# Patient Record
Sex: Male | Born: 1950 | Race: White | Hispanic: No | Smoking: Former smoker
Health system: Southern US, Community
[De-identification: ages and names within clinical notes are randomized; demographics above are authoritative.]

## PROBLEM LIST (undated history)

## (undated) DIAGNOSIS — E039 Hypothyroidism, unspecified: Secondary | ICD-10-CM

## (undated) DIAGNOSIS — E785 Hyperlipidemia, unspecified: Secondary | ICD-10-CM

## (undated) DIAGNOSIS — M549 Dorsalgia, unspecified: Secondary | ICD-10-CM

## (undated) DIAGNOSIS — I2510 Atherosclerotic heart disease of native coronary artery without angina pectoris: Secondary | ICD-10-CM

## (undated) DIAGNOSIS — I10 Essential (primary) hypertension: Secondary | ICD-10-CM

## (undated) DIAGNOSIS — M1990 Unspecified osteoarthritis, unspecified site: Secondary | ICD-10-CM

## (undated) DIAGNOSIS — E079 Disorder of thyroid, unspecified: Secondary | ICD-10-CM

## (undated) DIAGNOSIS — K5792 Diverticulitis of intestine, part unspecified, without perforation or abscess without bleeding: Secondary | ICD-10-CM

## (undated) DIAGNOSIS — Z951 Presence of aortocoronary bypass graft: Secondary | ICD-10-CM

## (undated) HISTORY — DX: Unspecified osteoarthritis, unspecified site: M19.90

## (undated) HISTORY — PX: UMBILICAL HERNIA REPAIR: SHX196

## (undated) HISTORY — PX: KNEE ARTHROSCOPY: SUR90

## (undated) HISTORY — DX: Hyperlipidemia, unspecified: E78.5

## (undated) HISTORY — DX: Atherosclerotic heart disease of native coronary artery without angina pectoris: I25.10

## (undated) HISTORY — DX: Presence of aortocoronary bypass graft: Z95.1

## (undated) HISTORY — PX: KNEE ARTHROSCOPY: SHX127

## (undated) HISTORY — PX: CORONARY ARTERY BYPASS GRAFT: SHX141

## (undated) HISTORY — PX: INGUINAL HERNIA REPAIR: SUR1180

## (undated) HISTORY — DX: Essential (primary) hypertension: I10

## (undated) HISTORY — DX: Hypothyroidism, unspecified: E03.9

---

## 2015-12-31 ENCOUNTER — Emergency Department (HOSPITAL_COMMUNITY)
Admission: EM | Admit: 2015-12-31 | Discharge: 2015-12-31 | Disposition: A | Discharge status: Home / Self Care | Payer: Medicare Other | Attending: Emergency Medicine | Admitting: Emergency Medicine

## 2015-12-31 ENCOUNTER — Encounter (HOSPITAL_COMMUNITY): Payer: Self-pay | Admitting: Emergency Medicine

## 2015-12-31 DIAGNOSIS — M5136 Other intervertebral disc degeneration, lumbar region: Secondary | ICD-10-CM | POA: Diagnosis not present

## 2015-12-31 DIAGNOSIS — S39012A Strain of muscle, fascia and tendon of lower back, initial encounter: Secondary | ICD-10-CM | POA: Diagnosis not present

## 2015-12-31 DIAGNOSIS — X500XXA Overexertion from strenuous movement or load, initial encounter: Secondary | ICD-10-CM | POA: Diagnosis not present

## 2015-12-31 DIAGNOSIS — I2510 Atherosclerotic heart disease of native coronary artery without angina pectoris: Secondary | ICD-10-CM | POA: Diagnosis not present

## 2015-12-31 DIAGNOSIS — Y929 Unspecified place or not applicable: Secondary | ICD-10-CM | POA: Insufficient documentation

## 2015-12-31 DIAGNOSIS — Y9389 Activity, other specified: Secondary | ICD-10-CM | POA: Diagnosis not present

## 2015-12-31 DIAGNOSIS — S3992XA Unspecified injury of lower back, initial encounter: Secondary | ICD-10-CM | POA: Diagnosis present

## 2015-12-31 DIAGNOSIS — Y999 Unspecified external cause status: Secondary | ICD-10-CM | POA: Insufficient documentation

## 2015-12-31 DIAGNOSIS — Z951 Presence of aortocoronary bypass graft: Secondary | ICD-10-CM | POA: Diagnosis not present

## 2015-12-31 HISTORY — DX: Dorsalgia, unspecified: M54.9

## 2015-12-31 HISTORY — DX: Atherosclerotic heart disease of native coronary artery without angina pectoris: I25.10

## 2015-12-31 HISTORY — DX: Disorder of thyroid, unspecified: E07.9

## 2015-12-31 HISTORY — DX: Diverticulitis of intestine, part unspecified, without perforation or abscess without bleeding: K57.92

## 2015-12-31 MED ORDER — HYDROCODONE-ACETAMINOPHEN 5-325 MG PO TABS: 1.0000 | ORAL_TABLET | ORAL | 0 refills | Status: DC | PRN

## 2015-12-31 MED ORDER — DEXAMETHASONE 4 MG PO TABS: 4.0000 mg | ORAL_TABLET | Freq: Two times a day (BID) | ORAL | 0 refills | Status: DC

## 2015-12-31 MED ORDER — CYCLOBENZAPRINE HCL 10 MG PO TABS: 10.0000 mg | ORAL_TABLET | Freq: Three times a day (TID) | ORAL | 0 refills | Status: DC

## 2015-12-31 MED ORDER — KETOROLAC TROMETHAMINE 60 MG/2ML IM SOLN
60.0000 mg | Freq: Once | INTRAMUSCULAR | Status: AC
  Administered 2015-12-31: 60 mg via INTRAMUSCULAR
  Filled 2015-12-31: qty 2

## 2015-12-31 MED ORDER — DEXAMETHASONE SODIUM PHOSPHATE 4 MG/ML IJ SOLN
8.0000 mg | Freq: Once | INTRAMUSCULAR | Status: AC
  Administered 2015-12-31: 8 mg via INTRAMUSCULAR
  Filled 2015-12-31: qty 2
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
## 2015-12-31 NOTE — ED Notes (Signed)
ED Provider at bedside. 

## 2015-12-31 NOTE — ED Triage Notes (Signed)
Pt reports low back pain for a few days after unloading moving truck.  States he just moved here and is out of his pain medications.

## 2015-12-31 NOTE — ED Notes (Signed)
Pt reports he moved down to [place] 2 days ago and been lifting and moving furniture for the past 2 days. Pt reports he has chronic back pain for which he uses Percocet for. Pt reports he has ran out of his Percocet and is "looking for someone to refill my Percocet" to help with his pain until he can get established with a new PCP here in [place]. Pt reports he also takes heart medication and thyroid medication, but those medications he currently has enough of.

## 2015-12-31 NOTE — Discharge Instructions (Signed)
Heating pad to your back maybe helpful. Please rest your back is much as possible. Use Flexeril and Decadron daily. Use Norco for pain if needed. Flexeril and Norco may cause drowsiness, please use these medications with caution. Please see Dr. Lorin Mercy for orthopedic evaluation and management.

## 2016-03-07 ENCOUNTER — Other Ambulatory Visit: Payer: Self-pay | Admitting: Family Medicine

## 2016-03-07 ENCOUNTER — Encounter: Payer: Self-pay | Admitting: Family Medicine

## 2016-03-07 ENCOUNTER — Ambulatory Visit (INDEPENDENT_AMBULATORY_CARE_PROVIDER_SITE_OTHER): Payer: Medicare HMO | Admitting: Family Medicine

## 2016-03-07 VITALS — BP 138/72 | HR 72 | Temp 98.0°F | Resp 16 | Ht 65.75 in | Wt 171.0 lb

## 2016-03-07 DIAGNOSIS — L570 Actinic keratosis: Secondary | ICD-10-CM

## 2016-03-07 DIAGNOSIS — M1710 Unilateral primary osteoarthritis, unspecified knee: Secondary | ICD-10-CM | POA: Insufficient documentation

## 2016-03-07 DIAGNOSIS — M179 Osteoarthritis of knee, unspecified: Secondary | ICD-10-CM | POA: Insufficient documentation

## 2016-03-07 DIAGNOSIS — M1711 Unilateral primary osteoarthritis, right knee: Secondary | ICD-10-CM | POA: Diagnosis not present

## 2016-03-07 DIAGNOSIS — E039 Hypothyroidism, unspecified: Secondary | ICD-10-CM | POA: Diagnosis not present

## 2016-03-07 DIAGNOSIS — I25810 Atherosclerosis of coronary artery bypass graft(s) without angina pectoris: Secondary | ICD-10-CM

## 2016-03-07 DIAGNOSIS — M19049 Primary osteoarthritis, unspecified hand: Secondary | ICD-10-CM

## 2016-03-07 DIAGNOSIS — M47816 Spondylosis without myelopathy or radiculopathy, lumbar region: Secondary | ICD-10-CM | POA: Insufficient documentation

## 2016-03-07 DIAGNOSIS — K5790 Diverticulosis of intestine, part unspecified, without perforation or abscess without bleeding: Secondary | ICD-10-CM | POA: Insufficient documentation

## 2016-03-07 DIAGNOSIS — G894 Chronic pain syndrome: Secondary | ICD-10-CM | POA: Diagnosis not present

## 2016-03-07 DIAGNOSIS — I2510 Atherosclerotic heart disease of native coronary artery without angina pectoris: Secondary | ICD-10-CM | POA: Insufficient documentation

## 2016-03-07 DIAGNOSIS — E7800 Pure hypercholesterolemia, unspecified: Secondary | ICD-10-CM

## 2016-03-07 DIAGNOSIS — K5730 Diverticulosis of large intestine without perforation or abscess without bleeding: Secondary | ICD-10-CM | POA: Diagnosis not present

## 2016-03-07 MED ORDER — METOPROLOL SUCCINATE ER 100 MG PO TB24: 100.0000 mg | ORAL_TABLET | Freq: Every day | ORAL | 6 refills | Status: DC

## 2016-03-07 MED ORDER — OXYCODONE-ACETAMINOPHEN 5-325 MG PO TABS: 1.0000 | ORAL_TABLET | Freq: Two times a day (BID) | ORAL | 0 refills | Status: DC | PRN

## 2016-03-07 MED ORDER — GEMFIBROZIL 600 MG PO TABS: 600.0000 mg | ORAL_TABLET | Freq: Two times a day (BID) | ORAL | 6 refills | Status: DC

## 2016-03-07 MED ORDER — LEVOTHYROXINE SODIUM 50 MCG PO TABS: 50.0000 ug | ORAL_TABLET | Freq: Every day | ORAL | 6 refills | Status: DC

## 2016-03-07 NOTE — Assessment & Plan Note (Signed)
Anoscopy is up-to-date. He tries to avoid foods that cause flares
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
## 2016-03-07 NOTE — Patient Instructions (Signed)
Return to have your fasting labs drawn Referral to cardiology Referral to dermatology F/U 4 months

## 2016-03-07 NOTE — Assessment & Plan Note (Signed)
We'll establish with cardiology will check his cholesterol levels forcefully he is hard he had an event in the gemfibrozil and niacin though they may lower his level some his LDL still too high and they're not giving much secondary prevention against another event. I think he may be a good candidate for one of the injectable cholesterol medications but will defer this to cardiology

## 2016-03-07 NOTE — Assessment & Plan Note (Signed)
Reviewed his records from neurosurgery and [facility] from 2017, maintained on percocet #60 Database reviewed for [place] ,no abberrant behavior UDS done today  Pain contract signed Given script for Percocet 5-325mg  BID prn #60 tablets

## 2016-03-07 NOTE — Assessment & Plan Note (Signed)
Continue Synthroid will recheck his thyroid function studies when he returns for his fasting labs

## 2016-03-16 LAB — DRUG ABUSE PANEL 10-50 NO CONF, U
AMPHETAMINES (1000 ng/mL SCRN): NEGATIVE
BARBITURATES: NEGATIVE
BENZODIAZEPINES: NEGATIVE
COCAINE METABOLITES: NEGATIVE
MARIJUANA MET (50 NG/ML SCRN): NEGATIVE
METHADONE: NEGATIVE
METHAQUALONE: NEGATIVE
OPIATES: NEGATIVE
PHENCYCLIDINE: NEGATIVE
PROPOXYPHENE: NEGATIVE

## 2016-03-18 ENCOUNTER — Other Ambulatory Visit: Payer: Self-pay

## 2016-03-19 ENCOUNTER — Other Ambulatory Visit: Payer: Medicare HMO

## 2016-03-19 ENCOUNTER — Other Ambulatory Visit: Payer: Self-pay | Admitting: Family Medicine

## 2016-03-19 DIAGNOSIS — I25810 Atherosclerosis of coronary artery bypass graft(s) without angina pectoris: Secondary | ICD-10-CM

## 2016-03-19 DIAGNOSIS — E039 Hypothyroidism, unspecified: Secondary | ICD-10-CM | POA: Diagnosis not present

## 2016-03-19 DIAGNOSIS — R7309 Other abnormal glucose: Secondary | ICD-10-CM | POA: Diagnosis not present

## 2016-03-19 LAB — LIPID PANEL
CHOLESTEROL: 239 mg/dL — AB (ref <200)
HDL: 51 mg/dL (ref >40)
LDL CALC: 143 mg/dL — AB (ref <100)
Total CHOL/HDL Ratio: 4.7 Ratio (ref <5.0)
Triglycerides: 227 mg/dL — ABNORMAL HIGH (ref <150)
VLDL: 45 mg/dL — AB (ref <30)

## 2016-03-19 LAB — CBC WITH DIFFERENTIAL/PLATELET
BASOS ABS: 0 {cells}/uL (ref 0–200)
Basophils Relative: 0 %
EOS ABS: 156 {cells}/uL (ref 15–500)
Eosinophils Relative: 3 %
HCT: 41.4 % (ref 38.5–50.0)
HEMOGLOBIN: 14.6 g/dL (ref 13.0–17.0)
Lymphocytes Relative: 39 %
Lymphs Abs: 2028 cells/uL (ref 850–3900)
MCH: 29.3 pg (ref 27.0–33.0)
MCHC: 35.3 g/dL (ref 32.0–36.0)
MCV: 83 fL (ref 80.0–100.0)
MONOS PCT: 15 %
MPV: 8.8 fL (ref 7.5–12.5)
Monocytes Absolute: 780 cells/uL (ref 200–950)
Neutro Abs: 2236 cells/uL (ref 1500–7800)
Neutrophils Relative %: 43 %
PLATELETS: 297 10*3/uL (ref 140–400)
RBC: 4.99 MIL/uL (ref 4.20–5.80)
RDW: 13.8 % (ref 11.0–15.0)
WBC: 5.2 10*3/uL (ref 3.8–10.8)

## 2016-03-19 LAB — T3, FREE: T3 FREE: 3.3 pg/mL (ref 2.3–4.2)

## 2016-03-19 LAB — COMPREHENSIVE METABOLIC PANEL
ALT: 23 U/L (ref 9–46)
AST: 24 U/L (ref 10–35)
Albumin: 4.4 g/dL (ref 3.6–5.1)
Alkaline Phosphatase: 59 U/L (ref 40–115)
BILIRUBIN TOTAL: 0.4 mg/dL (ref 0.2–1.2)
BUN: 22 mg/dL (ref 7–25)
CO2: 28 mmol/L (ref 20–31)
CREATININE: 0.87 mg/dL (ref 0.70–1.25)
Calcium: 9.7 mg/dL (ref 8.6–10.3)
Chloride: 102 mmol/L (ref 98–110)
Glucose, Bld: 128 mg/dL — ABNORMAL HIGH (ref 70–99)
Potassium: 4.4 mmol/L (ref 3.5–5.3)
SODIUM: 139 mmol/L (ref 135–146)
Total Protein: 7.4 g/dL (ref 6.1–8.1)

## 2016-03-19 LAB — T4, FREE: FREE T4: 1.1 ng/dL (ref 0.8–1.8)

## 2016-03-19 LAB — TSH: TSH: 1.33 mIU/L (ref 0.40–4.50)

## 2016-03-21 LAB — HEMOGLOBIN A1C
Hgb A1c MFr Bld: 5.6 % (ref <5.7)
Mean Plasma Glucose: 114 mg/dL

## 2016-04-07 ENCOUNTER — Telehealth: Payer: Self-pay | Admitting: *Deleted

## 2016-04-07 MED ORDER — OXYCODONE-ACETAMINOPHEN 5-325 MG PO TABS: 1.0000 | ORAL_TABLET | Freq: Two times a day (BID) | ORAL | 0 refills | Status: DC | PRN

## 2016-04-07 NOTE — Telephone Encounter (Signed)
Okay to refill? 

## 2016-04-07 NOTE — Telephone Encounter (Signed)
Prescription printed and patient made aware to come to office to pick up.  

## 2016-04-07 NOTE — Telephone Encounter (Signed)
Received call from patient.   Requested refill on Oxycodone.   Ok to refill??  Last office visit/ refill 03/07/2016.

## 2016-04-09 ENCOUNTER — Encounter: Payer: Self-pay | Admitting: Cardiovascular Disease

## 2016-04-09 ENCOUNTER — Ambulatory Visit (INDEPENDENT_AMBULATORY_CARE_PROVIDER_SITE_OTHER): Payer: Medicare HMO | Admitting: Cardiovascular Disease

## 2016-04-09 VITALS — BP 120/78 | HR 73 | Ht 67.0 in | Wt 172.0 lb

## 2016-04-09 DIAGNOSIS — E782 Mixed hyperlipidemia: Secondary | ICD-10-CM

## 2016-04-09 DIAGNOSIS — Z79899 Other long term (current) drug therapy: Secondary | ICD-10-CM | POA: Diagnosis not present

## 2016-04-09 DIAGNOSIS — I25708 Atherosclerosis of coronary artery bypass graft(s), unspecified, with other forms of angina pectoris: Secondary | ICD-10-CM | POA: Diagnosis not present

## 2016-04-09 MED ORDER — ROSUVASTATIN CALCIUM 5 MG PO TABS
5.0000 mg | ORAL_TABLET | Freq: Every day | ORAL | 3 refills | Status: DC
Start: 2016-04-09 — End: 2017-02-16

## 2016-04-09 MED ORDER — NITROGLYCERIN 0.4 MG SL SUBL: 0.4000 mg | SUBLINGUAL_TABLET | SUBLINGUAL | 3 refills | Status: DC | PRN

## 2016-04-09 NOTE — Patient Instructions (Addendum)
Medication Instructions:  START CRESTOR 5 MG DAILY  NITRO 4 MG   Labwork: Your physician recommends that you return for lab work in: 3 MONTHS  LIPIDS   Testing/Procedures: NONE  Follow-Up: Your physician wants you to follow-up in: 6 MONTHS.  You will receive a reminder letter in the mail two months in advance. If you don't receive a letter, please call our office to schedule the follow-up appointment.   Any Other Special Instructions Will Be Listed Below (If Applicable).     If you need a refill on your cardiac medications before your next appointment, please call your pharmacy. Nitroglycerin sublingual tablets What is this medicine? NITROGLYCERIN (nye troe GLI ser in) is a type of vasodilator. It relaxes blood vessels, increasing the blood and oxygen supply to your heart. This medicine is used to relieve chest pain caused by angina. It is also used to prevent chest pain before activities like climbing stairs, going outdoors in cold weather, or sexual activity. This medicine may be used for other purposes; ask your health care provider or pharmacist if you have questions. COMMON BRAND NAME(S): Nitroquick, Nitrostat, Nitrotab What should I tell my health care provider before I take this medicine? They need to know if you have any of these conditions: -anemia -head injury, recent stroke, or bleeding in the brain -liver disease -previous heart attack -an unusual or allergic reaction to nitroglycerin, other medicines, foods, dyes, or preservatives -pregnant or trying to get pregnant -breast-feeding How should I use this medicine? Take this medicine by mouth as needed. At the first sign of an angina attack (chest pain or tightness) place one tablet under your tongue. You can also take this medicine 5 to 10 minutes before an event likely to produce chest pain. Follow the directions on the prescription label. Let the tablet dissolve under the tongue. Do not swallow whole. Replace the  dose if you accidentally swallow it. It will help if your mouth is not dry. Saliva around the tablet will help it to dissolve more quickly. Do not eat or drink, smoke or chew tobacco while a tablet is dissolving. If you are not better within 5 minutes after taking ONE dose of nitroglycerin, call 9-1-1 immediately to seek emergency medical care. Do not take more than 3 nitroglycerin tablets over 15 minutes. If you take this medicine often to relieve symptoms of angina, your doctor or health care professional may provide you with different instructions to manage your symptoms. If symptoms do not go away after following these instructions, it is important to call 9-1-1 immediately. Do not take more than 3 nitroglycerin tablets over 15 minutes. Talk to your pediatrician regarding the use of this medicine in children. Special care may be needed. Overdosage: If you think you have taken too much of this medicine contact a poison control center or emergency room at once. NOTE: This medicine is only for you. Do not share this medicine with others. What if I miss a dose? This does not apply. This medicine is only used as needed. What may interact with this medicine? Do not take this medicine with any of the following medications: -certain migraine medicines like ergotamine and dihydroergotamine (DHE) -medicines used to treat erectile dysfunction like sildenafil, tadalafil, and vardenafil -riociguat This medicine may also interact with the following medications: -alteplase -aspirin -heparin -medicines for high blood pressure -medicines for mental depression -other medicines used to treat angina -phenothiazines like chlorpromazine, mesoridazine, prochlorperazine, thioridazine This list may not describe all possible interactions. Give  your health care provider a list of all the medicines, herbs, non-prescription drugs, or dietary supplements you use. Also tell them if you smoke, drink alcohol, or use illegal  drugs. Some items may interact with your medicine. What should I watch for while using this medicine? Tell your doctor or health care professional if you feel your medicine is no longer working. Keep this medicine with you at all times. Sit or lie down when you take your medicine to prevent falling if you feel dizzy or faint after using it. Try to remain calm. This will help you to feel better faster. If you feel dizzy, take several deep breaths and lie down with your feet propped up, or bend forward with your head resting between your knees. You may get drowsy or dizzy. Do not drive, use machinery, or do anything that needs mental alertness until you know how this drug affects you. Do not stand or sit up quickly, especially if you are an older patient. This reduces the risk of dizzy or fainting spells. Alcohol can make you more drowsy and dizzy. Avoid alcoholic drinks. Do not treat yourself for coughs, colds, or pain while you are taking this medicine without asking your doctor or health care professional for advice. Some ingredients may increase your blood pressure. What side effects may I notice from receiving this medicine? Side effects that you should report to your doctor or health care professional as soon as possible: -blurred vision -dry mouth -skin rash -sweating -the feeling of extreme pressure in the head -unusually weak or tired Side effects that usually do not require medical attention (report to your doctor or health care professional if they continue or are bothersome): -flushing of the face or neck -headache -irregular heartbeat, palpitations -nausea, vomiting This list may not describe all possible side effects. Call your doctor for medical advice about side effects. You may report side effects to FDA at 1-800-FDA-1088. Where should I keep my medicine? Keep out of the reach of children. Store at room temperature between 20 and 25 degrees C (68 and 77 degrees F). Store in  Chief of Staff. Protect from light and moisture. Keep tightly closed. Throw away any unused medicine after the expiration date. NOTE: This sheet is a summary. It may not cover all possible information. If you have questions about this medicine, talk to your doctor, pharmacist, or health care provider.  2018 Elsevier/Gold Standard (2012-10-21 17:57:36)
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
## 2016-04-17 DIAGNOSIS — L821 Other seborrheic keratosis: Secondary | ICD-10-CM | POA: Diagnosis not present

## 2016-04-17 DIAGNOSIS — L820 Inflamed seborrheic keratosis: Secondary | ICD-10-CM | POA: Insufficient documentation

## 2016-04-17 DIAGNOSIS — L570 Actinic keratosis: Secondary | ICD-10-CM | POA: Diagnosis not present

## 2016-04-17 DIAGNOSIS — Q8789 Other specified congenital malformation syndromes, not elsewhere classified: Secondary | ICD-10-CM | POA: Insufficient documentation

## 2016-04-17 DIAGNOSIS — D239 Other benign neoplasm of skin, unspecified: Secondary | ICD-10-CM | POA: Insufficient documentation

## 2016-04-17 DIAGNOSIS — L111 Transient acantholytic dermatosis [Grover]: Secondary | ICD-10-CM | POA: Diagnosis not present

## 2016-04-17 DIAGNOSIS — D2372 Other benign neoplasm of skin of left lower limb, including hip: Secondary | ICD-10-CM | POA: Diagnosis not present

## 2016-04-17 DIAGNOSIS — L814 Other melanin hyperpigmentation: Secondary | ICD-10-CM | POA: Diagnosis not present

## 2016-05-05 ENCOUNTER — Telehealth: Payer: Self-pay | Admitting: *Deleted

## 2016-05-05 MED ORDER — OXYCODONE-ACETAMINOPHEN 5-325 MG PO TABS: 1.0000 | ORAL_TABLET | Freq: Two times a day (BID) | ORAL | 0 refills | Status: DC | PRN

## 2016-05-05 NOTE — Telephone Encounter (Signed)
Prescription printed and patient made aware to come to office to pick up on 05/06/2016. 

## 2016-05-05 NOTE — Telephone Encounter (Signed)
Received call from patient.   Requested refill on Percocet.   Ok to refill??  Last office visit 03/07/2016.  Last refill 04/07/2016.

## 2016-05-05 NOTE — Telephone Encounter (Signed)
okay

## 2016-06-09 ENCOUNTER — Ambulatory Visit (INDEPENDENT_AMBULATORY_CARE_PROVIDER_SITE_OTHER): Payer: Medicare HMO | Admitting: Family Medicine

## 2016-06-09 ENCOUNTER — Encounter: Payer: Self-pay | Admitting: Family Medicine

## 2016-06-09 VITALS — BP 136/94 | HR 84 | Temp 97.9°F | Resp 18 | Ht 67.0 in | Wt 181.0 lb

## 2016-06-09 DIAGNOSIS — I25708 Atherosclerosis of coronary artery bypass graft(s), unspecified, with other forms of angina pectoris: Secondary | ICD-10-CM

## 2016-06-09 DIAGNOSIS — F418 Other specified anxiety disorders: Secondary | ICD-10-CM | POA: Diagnosis not present

## 2016-06-09 DIAGNOSIS — G894 Chronic pain syndrome: Secondary | ICD-10-CM | POA: Diagnosis not present

## 2016-06-09 DIAGNOSIS — N529 Male erectile dysfunction, unspecified: Secondary | ICD-10-CM | POA: Diagnosis not present

## 2016-06-09 DIAGNOSIS — E782 Mixed hyperlipidemia: Secondary | ICD-10-CM | POA: Diagnosis not present

## 2016-06-09 DIAGNOSIS — E785 Hyperlipidemia, unspecified: Secondary | ICD-10-CM | POA: Insufficient documentation

## 2016-06-09 DIAGNOSIS — F5104 Psychophysiologic insomnia: Secondary | ICD-10-CM | POA: Diagnosis not present

## 2016-06-09 MED ORDER — ZOSTER VAC RECOMB ADJUVANTED 50 MCG/0.5ML IM SUSR: 0.5000 mL | Freq: Once | INTRAMUSCULAR | 0 refills | Status: AC

## 2016-06-09 MED ORDER — OXYCODONE-ACETAMINOPHEN 5-325 MG PO TABS: 1.0000 | ORAL_TABLET | Freq: Two times a day (BID) | ORAL | 0 refills | Status: DC | PRN

## 2016-06-09 MED ORDER — TRAZODONE HCL 50 MG PO TABS: 25.0000 mg | ORAL_TABLET | Freq: Every evening | ORAL | 3 refills | Status: DC | PRN

## 2016-06-09 MED ORDER — DIAZEPAM 5 MG PO TABS: ORAL_TABLET | ORAL | 0 refills | Status: DC

## 2016-06-09 NOTE — Patient Instructions (Addendum)
Shingles vaccine sent to pharmacy Pain medication refilled Try the trazodone at bedtime Valium for flying Urology referral  F/U 4 months

## 2016-06-09 NOTE — Progress Notes (Signed)
   Subjective:    Patient ID: Stephen Booker, male    DOB: [birth year], 66 y.o.   MRN: [id number]  Patient presents for Follow-up and Medication Management Here to follow chronic medical problems. He establish care last visit. He is on chronic pain medication secondary to chronic back pain he is on Percocet5-325mg  twice a day as needed.  Hypothyroidism he's taking Synthroid as prescribed  History of coronary artery disease is taking his beta blocker and cholesterol medicines as prescribed. 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 He states that he is here alone and he often get anxious and depressed at times. He does not like to go out in public he has beer riding with other people flying sometimes large crowds. He states he will initially have panic attacks. I was asked for something to help him flies that he can go visit his family this summer. He doesn't generally still not sleeping very well intense as stated himself and in the house and he wants to get out and enjoy himself.  ED- using an injectable mix  Alprostadil/papaverine/Phentolamine  (90mcg/30mg /2mg )   Review Of Systems:  GEN- denies fatigue, fever, weight loss,weakness, recent illness HEENT- denies eye drainage, change in vision, nasal discharge, CVS- denies chest pain, palpitations RESP- denies SOB, cough, wheeze ABD- denies N/V, change in stools, abd pain GU- denies dysuria, hematuria, dribbling, incontinence MSK- denies joint pain, muscle aches, injury Neuro- denies headache, dizziness, syncope, seizure activity       Objective:    BP (!) 136/94   Pulse 84   Temp 97.9 F (36.6 C) (Oral)   Resp 18   Ht 5\' 7"  (1.702 m)   Wt 181 lb (82.1 kg)   BMI 28.35 kg/m  GEN- NAD,  alert and oriented x3 HEENT- PERRL, EOMI, non injected sclera, pink conjunctiva, MMM, oropharynx clear CVS- RRR, no murmur RESP-CTAB Psych- normal affect and mood, no SI, well groomed Pulses- Radial  2+        Assessment & Plan:      Problem List Items Addressed This Visit    Hyperlipidemia    Continue Crestor he will have repeat fasting labs with his cardiologist he seems to be tolerating okay.      ED (erectile dysfunction)    Refer to urology for this specialized mixture this is not something that I prescribe      Relevant Orders   Ambulatory referral to Urology   Chronic pain syndrome    Pain medication refilled.      Chronic insomnia   CAD (coronary artery disease) - Primary   Anxiety with depression    Decrease anxiety and depression. He seems to have a lot of social phobias as well. He declines therapy at this time. Let us start him on trazodone which will help with sleep as well as his mood. I did give him Valium that he continues when he does fly to see his family in a few weeks.         Note: This dictation was prepared with Dragon dictation along with smaller phrase technology. Any transcriptional errors that result from this process are unintentional.

## 2016-06-10 DIAGNOSIS — N529 Male erectile dysfunction, unspecified: Secondary | ICD-10-CM | POA: Insufficient documentation

## 2016-06-10 NOTE — Assessment & Plan Note (Signed)
Decrease anxiety and depression. He seems to have a lot of social phobias as well. He declines therapy at this time. Let us start him on trazodone which will help with sleep as well as his mood. I did give him Valium that he continues when he does fly to see his family in a few weeks.

## 2016-06-10 NOTE — Assessment & Plan Note (Addendum)
Refer to urology for this specialized mixture this is not something that I prescribe

## 2016-06-10 NOTE — Assessment & Plan Note (Signed)
Continue Crestor he will have repeat fasting labs with his cardiologist he seems to be tolerating okay.

## 2016-06-10 NOTE — Assessment & Plan Note (Signed)
Pain medication refilled

## 2016-07-04 ENCOUNTER — Other Ambulatory Visit: Payer: Self-pay | Admitting: *Deleted

## 2016-07-04 ENCOUNTER — Telehealth: Payer: Self-pay | Admitting: Cardiovascular Disease

## 2016-07-04 MED ORDER — GEMFIBROZIL 600 MG PO TABS: 600.0000 mg | ORAL_TABLET | Freq: Two times a day (BID) | ORAL | 3 refills | Status: DC

## 2016-07-04 MED ORDER — METOPROLOL SUCCINATE ER 100 MG PO TB24: 100.0000 mg | ORAL_TABLET | Freq: Every day | ORAL | 6 refills | Status: DC

## 2016-07-04 MED ORDER — OXYCODONE-ACETAMINOPHEN 5-325 MG PO TABS: 1.0000 | ORAL_TABLET | Freq: Two times a day (BID) | ORAL | 0 refills | Status: DC | PRN

## 2016-07-04 MED ORDER — METOPROLOL SUCCINATE ER 100 MG PO TB24: 100.0000 mg | ORAL_TABLET | Freq: Every day | ORAL | 3 refills | Status: DC

## 2016-07-04 NOTE — Telephone Encounter (Signed)
Received call from patient.   Requested refill on Metoprolol and Percocet.   Prescription sent to pharmacy for Metoprolol.   Ok to refill Percocet??  Last office visit/ refill 06/09/2016.

## 2016-07-04 NOTE — Telephone Encounter (Signed)
Okay to refill? 

## 2016-07-04 NOTE — Telephone Encounter (Signed)
Refilled meds for him,mailed lab slip for repeat lipids

## 2016-07-04 NOTE — Telephone Encounter (Signed)
Prescription printed and patient made aware to come to office to pick up.  

## 2016-07-04 NOTE — Telephone Encounter (Signed)
Patient left voice message stating that he needs to speak with someone regarding his medications.

## 2016-07-31 ENCOUNTER — Telehealth: Payer: Self-pay | Admitting: *Deleted

## 2016-07-31 NOTE — Telephone Encounter (Signed)
Received call from patient.   Requested refill on Oxycodone.   Ok to refill??  Last office visit 06/09/2016.  Last refill 07/04/2016.

## 2016-08-01 MED ORDER — OXYCODONE-ACETAMINOPHEN 5-325 MG PO TABS: 1.0000 | ORAL_TABLET | Freq: Two times a day (BID) | ORAL | 0 refills | Status: DC | PRN

## 2016-08-01 NOTE — Telephone Encounter (Signed)
okay

## 2016-08-01 NOTE — Telephone Encounter (Signed)
Prescription printed and patient can come to office to pick up after 4pm on 08/01/2016.  Call placed to patient. No answer. No VM.

## 2016-08-04 NOTE — Telephone Encounter (Signed)
Call placed to patient. No answer. No VM.  

## 2016-08-05 NOTE — Telephone Encounter (Signed)
Multiple calls placed to patient with no answer and no return call.   Message to be closed.  

## 2016-08-20 ENCOUNTER — Telehealth: Payer: Self-pay | Admitting: Cardiovascular Disease

## 2016-08-20 NOTE — Telephone Encounter (Signed)
Returned pt call. I explained to him that we didn't have a nurse that goes to draw blood, unless he was already assigned a HHN. He voiced understanding. He stated he will have his labs drawn @ his PCP and will ask them to send [place] the results.

## 2016-08-20 NOTE — Telephone Encounter (Signed)
Pt is supposed to have labs done, but he's unable to move really well in the mornings due to problems w/ his back. Would like to know if there is a home health nurse that could come out and draw the labs for him or what other options he could do. Please give him a call @ [phone]

## 2016-09-01 ENCOUNTER — Telehealth: Payer: Self-pay | Admitting: *Deleted

## 2016-09-01 NOTE — Telephone Encounter (Signed)
Received call from patient.   Requested refill on Oxycodone.   Ok to refill??  Last office visit 06/09/2016.  Last refill 08/01/2016.

## 2016-09-02 MED ORDER — OXYCODONE-ACETAMINOPHEN 5-325 MG PO TABS: 1.0000 | ORAL_TABLET | Freq: Two times a day (BID) | ORAL | 0 refills | Status: DC | PRN

## 2016-09-02 NOTE — Telephone Encounter (Signed)
Prescription printed and patient made aware to come to office to pick up after 2pm on 09/02/2016 per VM.

## 2016-09-02 NOTE — Telephone Encounter (Signed)
Okay to refill? 

## 2016-09-10 ENCOUNTER — Other Ambulatory Visit (HOSPITAL_COMMUNITY)
Admission: RE | Admit: 2016-09-10 | Discharge: 2016-09-10 | Disposition: A | Discharge status: Home / Self Care | Payer: Medicare HMO | Source: Ambulatory Visit | Attending: Cardiovascular Disease | Admitting: Cardiovascular Disease

## 2016-09-10 DIAGNOSIS — Z79899 Other long term (current) drug therapy: Secondary | ICD-10-CM | POA: Insufficient documentation

## 2016-09-10 LAB — LIPID PANEL
Cholesterol: 154 mg/dL (ref 0–200)
HDL: 44 mg/dL (ref >40)
LDL CALC: 73 mg/dL (ref 0–99)
Total CHOL/HDL Ratio: 3.5 RATIO
Triglycerides: 185 mg/dL — ABNORMAL HIGH (ref <150)
VLDL: 37 mg/dL (ref 0–40)

## 2016-09-19 ENCOUNTER — Ambulatory Visit (INDEPENDENT_AMBULATORY_CARE_PROVIDER_SITE_OTHER): Payer: Medicare HMO | Admitting: Urology

## 2016-09-19 DIAGNOSIS — N5201 Erectile dysfunction due to arterial insufficiency: Secondary | ICD-10-CM

## 2016-09-19 DIAGNOSIS — N400 Enlarged prostate without lower urinary tract symptoms: Secondary | ICD-10-CM | POA: Diagnosis not present

## 2016-09-26 ENCOUNTER — Telehealth: Payer: Self-pay | Admitting: *Deleted

## 2016-09-26 MED ORDER — OXYCODONE-ACETAMINOPHEN 5-325 MG PO TABS: 1.0000 | ORAL_TABLET | Freq: Two times a day (BID) | ORAL | 0 refills | Status: DC | PRN

## 2016-09-26 NOTE — Telephone Encounter (Signed)
Received call from patient.   Requested refill on Oxycodone.   Ok to refill??  Last office visit 06/09/2016.  Last refill 09/02/2016.

## 2016-09-26 NOTE — Telephone Encounter (Signed)
Prescription printed and patient made aware to come to office to pick up on 09/29/2016.

## 2016-09-26 NOTE — Telephone Encounter (Signed)
Okay to refill? 

## 2016-10-10 ENCOUNTER — Encounter: Payer: Self-pay | Admitting: Family Medicine

## 2016-10-10 ENCOUNTER — Ambulatory Visit (INDEPENDENT_AMBULATORY_CARE_PROVIDER_SITE_OTHER): Payer: Medicare HMO | Admitting: Family Medicine

## 2016-10-10 VITALS — BP 150/100 | HR 62 | Temp 98.4°F | Resp 16 | Ht 67.0 in | Wt 182.0 lb

## 2016-10-10 DIAGNOSIS — G894 Chronic pain syndrome: Secondary | ICD-10-CM | POA: Diagnosis not present

## 2016-10-10 DIAGNOSIS — I10 Essential (primary) hypertension: Secondary | ICD-10-CM | POA: Diagnosis not present

## 2016-10-10 DIAGNOSIS — Z23 Encounter for immunization: Secondary | ICD-10-CM

## 2016-10-10 DIAGNOSIS — F5104 Psychophysiologic insomnia: Secondary | ICD-10-CM

## 2016-10-10 DIAGNOSIS — I25708 Atherosclerosis of coronary artery bypass graft(s), unspecified, with other forms of angina pectoris: Secondary | ICD-10-CM

## 2016-10-10 DIAGNOSIS — E039 Hypothyroidism, unspecified: Secondary | ICD-10-CM

## 2016-10-10 MED ORDER — AMLODIPINE BESYLATE 5 MG PO TABS: 5.0000 mg | ORAL_TABLET | Freq: Every day | ORAL | 3 refills | Status: DC

## 2016-10-10 MED ORDER — OXYCODONE-ACETAMINOPHEN 5-325 MG PO TABS: 1.0000 | ORAL_TABLET | Freq: Three times a day (TID) | ORAL | 0 refills | Status: DC | PRN

## 2016-10-10 NOTE — Assessment & Plan Note (Signed)
I will increase his hydrocodone to 3 times a day. He has not had any  aberrant  behavior.

## 2016-10-10 NOTE — Progress Notes (Signed)
   Subjective:    Patient ID: Stephen Booker, male    DOB: [birth year], 66 y.o.   MRN: [id number]  Patient presents for Medication Management and Medication Refill  CAD- seen by [facility] had lipids done Sept Which were much improved. He has noted that he gets little more when it with his activities more than normal which is how he felt when he initially had his blockage. He has appointment scheduled for November. He has not been checking his blood pressure at home was elevated here in the office.  Seen by urology had prostate check, BPH, PSA 1.4  Had shingles vaccine - due for 2nd shot in December   Chronic insomnia- he stopped the trazodone, it gave him weird dreams    Has gained 10pounds since March- he likes panackes, potataoe patties, eats 2 cheesebugers, shephards pie, Cereal or oatmeal at  night when he gets hungry  Chronic pain from osteoarthritis he states that his pain medications are not lasting him throughout the day he is requesting an increase  Review Of Systems:  GEN- denies fatigue, fever, weight loss,weakness, recent illness HEENT- denies eye drainage, change in vision, nasal discharge, CVS- denies chest pain, palpitations RESP- denies SOB, cough, wheeze ABD- denies N/V, change in stools, abd pain GU- denies dysuria, hematuria, dribbling, incontinence MSK- + joint pain, muscle aches, injury Neuro- denies headache, dizziness, syncope, seizure activity       Objective:    BP (!) 150/100   Pulse 62   Temp 98.4 F (36.9 C) (Oral)   Resp 16   Ht 5\' 7"  (1.702 m)   Wt 182 lb (82.6 kg)   SpO2 97%   BMI 28.51 kg/m  GEN- NAD, alert and oriented x3 HEENT- PERRL, EOMI, non injected sclera, pink conjunctiva, MMM, oropharynx clear Neck- Supple, no thyromegaly, no JVD CVS- RRR, no murmur RESP-CTAB ABD-NABS,soft,NT,ND EXT- No edema Pulses- Radial, DP- 2+        Assessment & Plan:      Problem List Items Addressed This Visit      Unprioritized   CAD (coronary  artery disease)   Relevant Medications   amLODipine (NORVASC) 5 MG tablet   Other Relevant Orders   CBC with Differential/Platelet   Comprehensive metabolic panel   Hypothyroidism - Primary    Check thyroid function again with his elevated blood pressure and his weight gain      Relevant Orders   TSH   T3, free   T4, free   Hypertension    He has known coronary artery disease now with hypertension. We'll continue the metoprolol at amlodipine 5 mg.'s, check blood pressure at home. I am checking some labs today. He has appointment for November advised that if he gets chest pain or shortness of breath becomes persistent and he needs to be seen urgently by cardiology.      Relevant Medications   amLODipine (NORVASC) 5 MG tablet   Chronic pain syndrome    I will increase his hydrocodone to 3 times a day. He has not had any  aberrant  behavior.      Chronic insomnia    He is no longer using the trazodone did not like the side effects states that his sleep is okay for now.         Note: This dictation was prepared with Dragon dictation along with smaller phrase technology. Any transcriptional errors that result from this process are unintentional.

## 2016-10-10 NOTE — Assessment & Plan Note (Signed)
He is no longer using the trazodone did not like the side effects states that his sleep is okay for now.

## 2016-10-10 NOTE — Assessment & Plan Note (Signed)
Check thyroid function again with his elevated blood pressure and his weight gain

## 2016-10-10 NOTE — Assessment & Plan Note (Signed)
He has known coronary artery disease now with hypertension. We'll continue the metoprolol at amlodipine 5 mg.'s, check blood pressure at home. I am checking some labs today. He has appointment for November advised that if he gets chest pain or shortness of breath becomes persistent and he needs to be seen urgently by cardiology.

## 2016-10-10 NOTE — Patient Instructions (Signed)
Start norvasc 5mg  once a day  Continue all other medications We will call with lab results F/U 4 months

## 2016-10-11 LAB — CBC WITH DIFFERENTIAL/PLATELET
BASOS ABS: 30 {cells}/uL (ref 0–200)
Basophils Relative: 0.5 %
EOS PCT: 3.1 %
Eosinophils Absolute: 183 cells/uL (ref 15–500)
HCT: 42 % (ref 38.5–50.0)
Hemoglobin: 15 g/dL (ref 13.2–17.1)
Lymphs Abs: 2183 cells/uL (ref 850–3900)
MCH: 29.2 pg (ref 27.0–33.0)
MCHC: 35.7 g/dL (ref 32.0–36.0)
MCV: 81.9 fL (ref 80.0–100.0)
MONOS PCT: 13.8 %
MPV: 9.3 fL (ref 7.5–12.5)
NEUTROS PCT: 45.6 %
Neutro Abs: 2690 cells/uL (ref 1500–7800)
PLATELETS: 354 10*3/uL (ref 140–400)
RBC: 5.13 10*6/uL (ref 4.20–5.80)
RDW: 12.5 % (ref 11.0–15.0)
Total Lymphocyte: 37 %
WBC mixed population: 814 cells/uL (ref 200–950)
WBC: 5.9 10*3/uL (ref 3.8–10.8)

## 2016-10-11 LAB — COMPREHENSIVE METABOLIC PANEL
AG Ratio: 1.7 (calc) (ref 1.0–2.5)
ALBUMIN MSPROF: 4.8 g/dL (ref 3.6–5.1)
ALT: 24 U/L (ref 9–46)
AST: 28 U/L (ref 10–35)
Alkaline phosphatase (APISO): 63 U/L (ref 40–115)
BUN: 13 mg/dL (ref 7–25)
CO2: 29 mmol/L (ref 20–32)
CREATININE: 0.92 mg/dL (ref 0.70–1.25)
Calcium: 10.1 mg/dL (ref 8.6–10.3)
Chloride: 100 mmol/L (ref 98–110)
GLUCOSE: 93 mg/dL (ref 65–99)
Globulin: 2.8 g/dL (calc) (ref 1.9–3.7)
Potassium: 4.5 mmol/L (ref 3.5–5.3)
SODIUM: 138 mmol/L (ref 135–146)
Total Bilirubin: 0.6 mg/dL (ref 0.2–1.2)
Total Protein: 7.6 g/dL (ref 6.1–8.1)

## 2016-10-11 LAB — T3, FREE: T3, Free: 3.2 pg/mL (ref 2.3–4.2)

## 2016-10-11 LAB — T4, FREE: Free T4: 1.1 ng/dL (ref 0.8–1.8)

## 2016-10-11 LAB — TSH: TSH: 1 mIU/L (ref 0.40–4.50)

## 2016-10-13 ENCOUNTER — Encounter: Payer: Self-pay | Admitting: *Deleted

## 2016-10-20 ENCOUNTER — Encounter: Payer: Self-pay | Admitting: Family Medicine

## 2016-10-20 ENCOUNTER — Ambulatory Visit (INDEPENDENT_AMBULATORY_CARE_PROVIDER_SITE_OTHER): Payer: Medicare HMO | Admitting: Family Medicine

## 2016-10-20 VITALS — BP 136/80 | HR 90 | Temp 99.2°F | Resp 18 | Ht 67.0 in | Wt 178.0 lb

## 2016-10-20 DIAGNOSIS — J069 Acute upper respiratory infection, unspecified: Secondary | ICD-10-CM | POA: Diagnosis not present

## 2016-10-20 MED ORDER — AZITHROMYCIN 250 MG PO TABS: ORAL_TABLET | ORAL | 0 refills | Status: DC

## 2016-10-20 NOTE — Patient Instructions (Signed)
Use robitussin Take antibiotic Try to get the fluids in
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
## 2016-10-23 ENCOUNTER — Telehealth: Payer: Self-pay | Admitting: Family Medicine

## 2016-10-23 MED ORDER — PREDNISONE 20 MG PO TABS: ORAL_TABLET | ORAL | 0 refills | Status: DC

## 2016-10-23 MED ORDER — ALBUTEROL SULFATE HFA 108 (90 BASE) MCG/ACT IN AERS: 2.0000 | INHALATION_SPRAY | Freq: Four times a day (QID) | RESPIRATORY_TRACT | 11 refills | Status: DC | PRN

## 2016-10-23 NOTE — Telephone Encounter (Signed)
Agree with above 

## 2016-10-23 NOTE — Telephone Encounter (Signed)
PATIENT IS CALLING TO SAY HE IS NOT BETTER WOULD LIKE SOMETHING CALLED IN IF POSSIBLE  [phone]

## 2016-10-23 NOTE — Addendum Note (Signed)
Addended by: Shary Decamp B on: 10/23/2016 04:49 PM   Modules accepted: Orders

## 2016-10-23 NOTE — Telephone Encounter (Signed)
Call placed to patient.   States that he is slightly improving, but is beginning to have some wheezing. Reviewed last note on 10/20/2016 and noted that provider recommendations were as follows: If he does develop more wheezing or difficulty breathing and will add in the steroid and albuterol inhaler.  Prescriptions sent to pharmacy for Prednisone taper and Albuterol inhaler.

## 2016-10-27 ENCOUNTER — Telehealth: Payer: Self-pay | Admitting: *Deleted

## 2016-10-27 ENCOUNTER — Ambulatory Visit (HOSPITAL_COMMUNITY)
Admission: RE | Admit: 2016-10-27 | Discharge: 2016-10-27 | Disposition: A | Discharge status: Home / Self Care | Payer: Medicare HMO | Source: Ambulatory Visit | Attending: Family Medicine | Admitting: Family Medicine

## 2016-10-27 DIAGNOSIS — R05 Cough: Secondary | ICD-10-CM

## 2016-10-27 DIAGNOSIS — R059 Cough, unspecified: Secondary | ICD-10-CM

## 2016-10-27 DIAGNOSIS — R079 Chest pain, unspecified: Secondary | ICD-10-CM | POA: Diagnosis not present

## 2016-10-27 NOTE — Telephone Encounter (Signed)
CXR ordered.   Appointment scheduled.

## 2016-10-27 NOTE — Telephone Encounter (Signed)
Okay to order CXR Get OV for recheck tomorrow or Wed

## 2016-10-27 NOTE — Telephone Encounter (Signed)
Received call from patient.   Reports that he continues to have cough and slight wheeze. Requested order for CXR.   MD please advise.

## 2016-10-29 ENCOUNTER — Encounter: Payer: Self-pay | Admitting: Family Medicine

## 2016-10-29 ENCOUNTER — Ambulatory Visit (INDEPENDENT_AMBULATORY_CARE_PROVIDER_SITE_OTHER): Payer: Medicare HMO | Admitting: Family Medicine

## 2016-10-29 VITALS — BP 138/82 | HR 70 | Temp 98.5°F | Resp 16 | Ht 67.0 in | Wt 181.0 lb

## 2016-10-29 DIAGNOSIS — J40 Bronchitis, not specified as acute or chronic: Secondary | ICD-10-CM

## 2016-10-29 MED ORDER — OXYCODONE-ACETAMINOPHEN 5-325 MG PO TABS: 1.0000 | ORAL_TABLET | Freq: Three times a day (TID) | ORAL | 0 refills | Status: DC | PRN

## 2016-10-29 NOTE — Progress Notes (Signed)
   Subjective:    Patient ID: Stephen Booker, male    DOB: 11-Feb-1950, 66 y.o.   MRN: [id number]  Patient presents for Follow-up (cough/CXR) and Medication Management (requested refill on Oxycodone, but per last note, medication is to be increased to TID- was not printed on 10/10/2016 when change took place)  Here for follow-up on URI completed, zpak, has some robitussin DM, 1 more day of his steroid left.  He continues to have some cough but is nonproductive. X-ray was done yesterday which showed no acute abnormality  Not had any further fever no chest pain  Requests a refill on his pain medication  Review Of Systems:  GEN- denies fatigue, fever, weight loss,weakness, recent illness HEENT- denies eye drainage, change in vision, nasal discharge, CVS- denies chest pain, palpitations RESP- denies SOB, +cough, wheeze ABD- denies N/V, change in stools, abd pain GU- denies dysuria, hematuria, dribbling, incontinence MSK- denies joint pain, muscle aches, injury Neuro- denies headache, dizziness, syncope, seizure activity       Objective:    BP 138/82   Pulse 70   Temp 98.5 F (36.9 C) (Oral)   Resp 16   Ht 5\' 7"  (1.702 m)   Wt 181 lb (82.1 kg)   SpO2 97%   BMI 28.35 kg/m  GEN- NAD, alert and oriented x3 HEENT- PERRL, EOMI, non injected sclera, pink conjunctiva, MMM, oropharynx clear,nares clear  Neck- Supple, no LAD CVS- RRR, no murmur RESP-CTAB EXT- No edema Pulses- Radial 2+        Assessment & Plan:      Problem List Items Addressed This Visit    None    Visit Diagnoses    Bronchitis    -  Primary   Now with more bronchitis couugh lingering, please steroids continue Robitussin-DM pain medicine also refilled      Note: This dictation was prepared with Dragon dictation along with smaller phrase technology. Any transcriptional errors that result from this process are unintentional.

## 2016-10-29 NOTE — Patient Instructions (Signed)
Use the robitussin Complete the steroids  F/U as needed

## 2016-11-04 ENCOUNTER — Telehealth: Payer: Self-pay | Admitting: *Deleted

## 2016-11-04 NOTE — Telephone Encounter (Signed)
Received call from patient.   Reports that he continues to have nonproductive cough with chest and back pain D/T prolonged bouts of coughing. Inquired as to if he should go to ER for evaluation.   Advised CXR clear. Patient has completed ABTx and steroids. No further tx required. Cough will linger and can use OTC cough suppressants. Increase fluids and rest.   MD aware.

## 2016-11-06 ENCOUNTER — Ambulatory Visit (INDEPENDENT_AMBULATORY_CARE_PROVIDER_SITE_OTHER): Payer: Medicare HMO | Admitting: Cardiovascular Disease

## 2016-11-06 ENCOUNTER — Encounter: Payer: Self-pay | Admitting: Cardiovascular Disease

## 2016-11-06 VITALS — BP 126/82 | HR 94 | Ht 67.0 in | Wt 177.0 lb

## 2016-11-06 DIAGNOSIS — J069 Acute upper respiratory infection, unspecified: Secondary | ICD-10-CM | POA: Diagnosis not present

## 2016-11-06 DIAGNOSIS — E782 Mixed hyperlipidemia: Secondary | ICD-10-CM

## 2016-11-06 DIAGNOSIS — I25708 Atherosclerosis of coronary artery bypass graft(s), unspecified, with other forms of angina pectoris: Secondary | ICD-10-CM

## 2016-11-06 DIAGNOSIS — E039 Hypothyroidism, unspecified: Secondary | ICD-10-CM | POA: Diagnosis not present
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
## 2016-11-06 NOTE — Patient Instructions (Signed)
Your physician recommends that you schedule a follow-up appointment in: 3 months with Dr.Koneswaran    Your physician recommends that you continue on your current medications as directed. Please refer to the Current Medication list given to you today.   If you need a refill on your cardiac medications before your next appointment, please call your pharmacy.   No lab work or tests ordered today.     Thank you for choosing [facility] !         

## 2016-11-07 ENCOUNTER — Telehealth: Payer: Self-pay | Admitting: *Deleted

## 2016-11-07 NOTE — Telephone Encounter (Signed)
Received call from patient.   Reports that productive cough continues with green colored mucus.   MD please advise.

## 2016-11-07 NOTE — Telephone Encounter (Signed)
His xray was normal, use albuterol inhaler  Continue mucinex DM or robitussin DM for cough  Add anti-histamine as well

## 2016-11-07 NOTE — Telephone Encounter (Signed)
Call placed to patient and patient made aware.  

## 2016-11-08 ENCOUNTER — Emergency Department (HOSPITAL_COMMUNITY)
Admission: EM | Admit: 2016-11-08 | Discharge: 2016-11-08 | Disposition: A | Discharge status: Home / Self Care | Payer: Medicare HMO | Attending: Emergency Medicine | Admitting: Emergency Medicine

## 2016-11-08 ENCOUNTER — Encounter (HOSPITAL_COMMUNITY): Payer: Self-pay | Admitting: Emergency Medicine

## 2016-11-08 ENCOUNTER — Emergency Department (HOSPITAL_COMMUNITY): Payer: Medicare HMO

## 2016-11-08 DIAGNOSIS — I2510 Atherosclerotic heart disease of native coronary artery without angina pectoris: Secondary | ICD-10-CM | POA: Insufficient documentation

## 2016-11-08 DIAGNOSIS — Z951 Presence of aortocoronary bypass graft: Secondary | ICD-10-CM | POA: Diagnosis not present

## 2016-11-08 DIAGNOSIS — I10 Essential (primary) hypertension: Secondary | ICD-10-CM | POA: Diagnosis not present

## 2016-11-08 DIAGNOSIS — R1084 Generalized abdominal pain: Secondary | ICD-10-CM | POA: Diagnosis not present

## 2016-11-08 DIAGNOSIS — Z79899 Other long term (current) drug therapy: Secondary | ICD-10-CM | POA: Diagnosis not present

## 2016-11-08 DIAGNOSIS — R05 Cough: Secondary | ICD-10-CM | POA: Diagnosis not present

## 2016-11-08 DIAGNOSIS — E039 Hypothyroidism, unspecified: Secondary | ICD-10-CM | POA: Diagnosis not present

## 2016-11-08 DIAGNOSIS — Z87891 Personal history of nicotine dependence: Secondary | ICD-10-CM | POA: Diagnosis not present

## 2016-11-08 DIAGNOSIS — Z7982 Long term (current) use of aspirin: Secondary | ICD-10-CM | POA: Diagnosis not present

## 2016-11-08 DIAGNOSIS — R059 Cough, unspecified: Secondary | ICD-10-CM

## 2016-11-08 LAB — CBC WITH DIFFERENTIAL/PLATELET
BASOS ABS: 0 10*3/uL (ref 0.0–0.1)
BASOS PCT: 0 %
Eosinophils Absolute: 0.1 10*3/uL (ref 0.0–0.7)
Eosinophils Relative: 1 %
HEMATOCRIT: 38.4 % — AB (ref 39.0–52.0)
Hemoglobin: 13.7 g/dL (ref 13.0–17.0)
LYMPHS PCT: 18 %
Lymphs Abs: 1.4 10*3/uL (ref 0.7–4.0)
MCH: 29.9 pg (ref 26.0–34.0)
MCHC: 35.7 g/dL (ref 30.0–36.0)
MCV: 83.8 fL (ref 78.0–100.0)
MONO ABS: 0.8 10*3/uL (ref 0.1–1.0)
Monocytes Relative: 10 %
NEUTROS ABS: 5.4 10*3/uL (ref 1.7–7.7)
Neutrophils Relative %: 71 %
PLATELETS: 330 10*3/uL (ref 150–400)
RBC: 4.58 MIL/uL (ref 4.22–5.81)
RDW: 12.4 % (ref 11.5–15.5)
WBC: 7.6 10*3/uL (ref 4.0–10.5)

## 2016-11-08 LAB — COMPREHENSIVE METABOLIC PANEL
ALT: 18 U/L (ref 17–63)
AST: 27 U/L (ref 15–41)
Albumin: 4.2 g/dL (ref 3.5–5.0)
Alkaline Phosphatase: 66 U/L (ref 38–126)
Anion gap: 12 (ref 5–15)
BILIRUBIN TOTAL: 0.6 mg/dL (ref 0.3–1.2)
BUN: 11 mg/dL (ref 6–20)
CHLORIDE: 101 mmol/L (ref 101–111)
CO2: 26 mmol/L (ref 22–32)
CREATININE: 0.88 mg/dL (ref 0.61–1.24)
Calcium: 9.8 mg/dL (ref 8.9–10.3)
GFR calc Af Amer: >60 mL/min (ref >60)
Glucose, Bld: 139 mg/dL — ABNORMAL HIGH (ref 65–99)
Potassium: 3.7 mmol/L (ref 3.5–5.1)
Sodium: 139 mmol/L (ref 135–145)
TOTAL PROTEIN: 8.2 g/dL — AB (ref 6.5–8.1)

## 2016-11-08 MED ORDER — OXYCODONE-ACETAMINOPHEN 5-325 MG PO TABS: 1.0000 | ORAL_TABLET | Freq: Four times a day (QID) | ORAL | 0 refills | Status: DC | PRN

## 2016-11-08 MED ORDER — ONDANSETRON HCL 4 MG/2ML IJ SOLN
4.0000 mg | Freq: Once | INTRAMUSCULAR | Status: AC
Start: 2016-11-08 — End: 2016-11-08
  Administered 2016-11-08: 4 mg via INTRAVENOUS
  Filled 2016-11-08: qty 2

## 2016-11-08 MED ORDER — ALBUTEROL SULFATE (2.5 MG/3ML) 0.083% IN NEBU
5.0000 mg | INHALATION_SOLUTION | Freq: Once | RESPIRATORY_TRACT | Status: AC
  Administered 2016-11-08: 5 mg via RESPIRATORY_TRACT
  Filled 2016-11-08: qty 6

## 2016-11-08 MED ORDER — HYDROMORPHONE HCL 1 MG/ML IJ SOLN
0.5000 mg | Freq: Once | INTRAMUSCULAR | Status: AC
  Administered 2016-11-08: 0.5 mg via INTRAVENOUS
  Filled 2016-11-08: qty 1

## 2016-11-08 MED ORDER — LEVOFLOXACIN 500 MG PO TABS: 500.0000 mg | ORAL_TABLET | Freq: Every day | ORAL | 0 refills | Status: DC

## 2016-11-08 MED ORDER — METHYLPREDNISOLONE SODIUM SUCC 125 MG IJ SOLR
125.0000 mg | Freq: Once | INTRAMUSCULAR | Status: AC
  Administered 2016-11-08: 125 mg via INTRAVENOUS
  Filled 2016-11-08: qty 2

## 2016-11-08 NOTE — ED Notes (Signed)
Pt request pain meds   He has taken percocet x 2- advised that physician will have to dictate meds as RN cannot

## 2016-11-08 NOTE — ED Notes (Signed)
From radiology 

## 2016-11-08 NOTE — ED Triage Notes (Signed)
Cough x 1 month, today when coughing had sudden onset of L rib pain   Took to Oxy without relief

## 2016-11-08 NOTE — Discharge Instructions (Signed)
Follow up with your md next week for recheck °

## 2016-11-08 NOTE — ED Provider Notes (Signed)
[facility] EMERGENCY DEPARTMENT Provider Note   CSN: [id number] Arrival date & time: 11/08/16  1504     History   Chief Complaint Chief Complaint  Patient presents with  . Cough    HPI Stephen Booker is a 66 y.o. male.  Patient complains of a cough for a few weeks now.  Patient states now his left side of his chest hurts after he was coughing hard.  No fevers no chills mild shortness of breath   The history is provided by the patient.  Cough  This is a new problem. The current episode started more than 1 week ago. The problem occurs constantly. The cough is non-productive. There has been no fever. Associated symptoms include chest pain. Pertinent negatives include no headaches.    Past Medical History:  Diagnosis Date  . Arthritis   . Back pain   . Coronary artery disease   . Diverticulitis   . Hyperlipidemia   . Hypertension   . S/P triple vessel bypass   . Thyroid disease     Patient Active Problem List   Diagnosis Date Noted  . Hypertension 10/10/2016  . ED (erectile dysfunction) 06/10/2016  . Hyperlipidemia 06/09/2016  . Chronic insomnia 06/09/2016  . Anxiety with depression 06/09/2016  . CAD (coronary artery disease) 03/07/2016  . Diverticulosis 03/07/2016  . Chronic pain syndrome 03/07/2016  . Lumbar spondylosis 03/07/2016  . Hypothyroidism 03/07/2016  . OA (osteoarthritis) of knee 03/07/2016  . Arthritis of hand 03/07/2016    Past Surgical History:  Procedure Laterality Date  . CORONARY ARTERY BYPASS GRAFT         Home Medications    Prior to Admission medications   Medication Sig Start Date End Date Taking? Authorizing Provider  amLODipine (NORVASC) 5 MG tablet Take 1 tablet (5 mg total) by mouth daily. Patient not taking: Reported on 11/06/2016 10/10/16   Alycia Rossetti, MD  aspirin EC 81 MG tablet Take 81 mg by mouth daily.    [provider]  diazepam (VALIUM) 5 MG tablet Take 1 hour before flight/rides 06/09/16   [place], Modena Nunnery, MD  gemfibrozil (LOPID) 600 MG tablet Take 1 tablet (600 mg total) by mouth 2 (two) times daily before a meal. 07/04/16   Herminio Commons, MD  levofloxacin (LEVAQUIN) 500 MG tablet Take 1 tablet (500 mg total) by mouth daily. 11/08/16   Milton Ferguson, MD  levothyroxine (SYNTHROID, LEVOTHROID) 50 MCG tablet Take 1 tablet (50 mcg total) by mouth daily before breakfast. 03/07/16   Alycia Rossetti, MD  metoprolol succinate (TOPROL-XL) 100 MG 24 hr tablet Take 1 tablet (100 mg total) by mouth daily. Take with or immediately following a meal. 07/04/16   Herminio Commons, MD  niacin 500 MG CR capsule Take 500 mg by mouth 3 (three) times daily.    [provider]  nitroGLYCERIN (NITROSTAT) 0.4 MG SL tablet Place 1 tablet (0.4 mg total) under the tongue every 5 (five) minutes as needed for chest pain. 04/09/16 11/06/16  Herminio Commons, MD  oxyCODONE-acetaminophen (PERCOCET/ROXICET) 5-325 MG tablet Take 1 tablet by mouth every 6 (six) hours as needed. 11/08/16   Milton Ferguson, MD  Papav-Phentolamine-Alprostadil 12-1-0.01 MG/ML SOLN by Intracavernosal route.    [provider]  rosuvastatin (CRESTOR) 5 MG tablet Take 1 tablet (5 mg total) by mouth daily. 04/09/16 11/06/16  Herminio Commons, MD    Family History Family History  Problem Relation Age of Onset  . Arthritis Mother   .  Heart disease Mother   . Cancer Father   . Early death Brother   . Cancer Brother        oral cancer  . COPD Brother   . Arthritis Sister   . Drug abuse Sister   . Early death Sister   . Cancer Maternal Aunt        Colon cancer     Social History Social History  Substance Use Topics  . Smoking status: Former Research scientist (life sciences)  . Smokeless tobacco: Never Used  . Alcohol use No     Allergies   Statins and Penicillins   Review of Systems Review of Systems  Constitutional: Negative for appetite change and fatigue.  HENT: Negative for congestion, ear discharge and sinus pressure.   Eyes:  Negative for discharge.  Respiratory: Positive for cough.   Cardiovascular: Positive for chest pain.  Gastrointestinal: Negative for abdominal pain and diarrhea.  Genitourinary: Negative for frequency and hematuria.  Musculoskeletal: Negative for back pain.  Skin: Negative for rash.  Neurological: Negative for seizures and headaches.  Psychiatric/Behavioral: Negative for hallucinations.     Physical Exam Updated Vital Signs BP 124/83   Pulse 74   Temp 98.3 F (36.8 C) (Oral)   Resp 20   Ht 5\' 7"  (1.702 m)   Wt 77.1 kg (170 lb)   SpO2 95%   BMI 26.63 kg/m   Physical Exam  Constitutional: He is oriented to person, place, and time. He appears well-developed.  HENT:  Head: Normocephalic.  Eyes: Conjunctivae and EOM are normal. No scleral icterus.  Neck: Neck supple. No thyromegaly present.  Cardiovascular: Normal rate and regular rhythm.  Exam reveals no gallop and no friction rub.   No murmur heard. Tender left lateral ribs  Pulmonary/Chest: No stridor. He has no wheezes. He has no rales. He exhibits no tenderness.  Abdominal: He exhibits no distension. There is no tenderness. There is no rebound.  Musculoskeletal: Normal range of motion. He exhibits no edema.  Lymphadenopathy:    He has no cervical adenopathy.  Neurological: He is oriented to person, place, and time. He exhibits normal muscle tone. Coordination normal.  Skin: No rash noted. No erythema.  Psychiatric: He has a normal mood and affect. His behavior is normal.     ED Treatments / Results  Labs (all labs ordered are listed, but only abnormal results are displayed) Labs Reviewed  CBC WITH DIFFERENTIAL/PLATELET - Abnormal; Notable for the following:       Result Value   HCT 38.4 (*)    All other components within normal limits  COMPREHENSIVE METABOLIC PANEL - Abnormal; Notable for the following:    Glucose, Bld 139 (*)    Total Protein 8.2 (*)    All other components within normal limits    EKG  EKG  Interpretation None       Radiology Dg Chest 2 View  Result Date: 11/08/2016 CLINICAL DATA:  Productive cough for the past month.  Left rib pain. EXAM: CHEST  2 VIEW COMPARISON:  10/27/2016. FINDINGS: Normal sized heart. A poor inspiration is again demonstrated with interval linear density in both lower lung zones. Diffuse peribronchial thickening with progression. Median sternotomy wires. Thoracic spine degenerative changes. IMPRESSION: 1. Mild to moderate bronchitic changes with progression. 2. Interval bibasilar linear atelectasis. Electronically Signed   By: Claudie Revering M.D.   On: 11/08/2016 15:44    Procedures Procedures (including critical care time)  Medications Ordered in ED Medications  albuterol (PROVENTIL) (2.5 MG/3ML) 0.083%  nebulizer solution 5 mg (5 mg Nebulization Given 11/08/16 1546)  HYDROmorphone (DILAUDID) injection 0.5 mg (0.5 mg Intravenous Given 11/08/16 1557)  ondansetron (ZOFRAN) injection 4 mg (4 mg Intravenous Given 11/08/16 1557)  methylPREDNISolone sodium succinate (SOLU-MEDROL) 125 mg/2 mL injection 125 mg (125 mg Intravenous Given 11/08/16 1558)     Initial Impression / Assessment and Plan / ED Course  I have reviewed the triage vital signs and the nursing notes.  Pertinent labs & imaging results that were available during my care of the patient were reviewed by me and considered in my medical decision making (see chart for details). Patient with bronchitis and left rib pain.  Patient will be placed on Levaquin for persistent cough and given pain medicine and will follow up with his PCP this week      Final Clinical Impressions(s) / ED Diagnoses   Final diagnoses:  Cough    New Prescriptions New Prescriptions   LEVOFLOXACIN (LEVAQUIN) 500 MG TABLET    Take 1 tablet (500 mg total) by mouth daily.   OXYCODONE-ACETAMINOPHEN (PERCOCET/ROXICET) 5-325 MG TABLET    Take 1 tablet by mouth every 6 (six) hours as needed.     Milton Ferguson, MD 11/08/16  [phone]

## 2016-11-10 ENCOUNTER — Telehealth: Payer: Self-pay | Admitting: *Deleted

## 2016-11-10 ENCOUNTER — Other Ambulatory Visit: Payer: Self-pay | Admitting: *Deleted

## 2016-11-10 MED ORDER — LEVOFLOXACIN 500 MG PO TABS: 500.0000 mg | ORAL_TABLET | Freq: Every day | ORAL | 0 refills | Status: DC

## 2016-11-10 MED ORDER — PREDNISONE 20 MG PO TABS: ORAL_TABLET | ORAL | 0 refills | Status: DC

## 2016-11-10 MED ORDER — LEVOTHYROXINE SODIUM 50 MCG PO TABS: 50.0000 ug | ORAL_TABLET | Freq: Every day | ORAL | 6 refills | Status: DC

## 2016-11-10 NOTE — Telephone Encounter (Signed)
Received call from patient.   States that he was seen in ER on Saturday for cough and chest pain. States that he was given ABTx and pain medication for cough and L Rib Fx. Advised to F/U with PCP in 1 week. Notes in chart do not mention fx.   Call placed to patient. States that he was not able to go to pharmacy to get ABTx. States that he will not be able to go pick up prescription today. Prescription for Levaquin sent to [facility]. Requested to have prescription sent to pharmacy that will deliver. Ok to resend to [facility]?  Also advised to use pillow to brace with cough, continue to use inhalers and robitussin, and to F/U in office. Appointment scheduled.

## 2016-11-10 NOTE — Telephone Encounter (Signed)
Call placed to patient and patient made aware.   [facility] does not deliver to [facility].   Prescriptions sent to [facility] who will deliver for patient.

## 2016-11-10 NOTE — Telephone Encounter (Signed)
Send over another prednisone taper  Continue inhaler  He should also have the percocet already to take three times a day as needed for rib pain  He has bronchitis like we discussed, no PNA on his repeat CXR either   Send prednisone and levaquin to [place], see if they can deliver to patient, he will need to give them his insurance information

## 2016-11-12 ENCOUNTER — Encounter: Payer: Self-pay | Admitting: Family Medicine

## 2016-11-12 ENCOUNTER — Ambulatory Visit: Payer: Medicare HMO | Admitting: Family Medicine

## 2016-11-12 VITALS — BP 138/82 | HR 70 | Temp 98.2°F | Resp 18 | Ht 67.0 in | Wt 179.0 lb

## 2016-11-12 DIAGNOSIS — J40 Bronchitis, not specified as acute or chronic: Secondary | ICD-10-CM | POA: Diagnosis not present

## 2016-11-12 MED ORDER — ALBUTEROL SULFATE (2.5 MG/3ML) 0.083% IN NEBU
2.5000 mg | INHALATION_SOLUTION | Freq: Once | RESPIRATORY_TRACT | Status: AC
  Administered 2016-11-12: 2.5 mg via RESPIRATORY_TRACT

## 2016-11-12 MED ORDER — OXYCODONE-ACETAMINOPHEN 5-325 MG PO TABS: 1.0000 | ORAL_TABLET | Freq: Three times a day (TID) | ORAL | 0 refills | Status: DC | PRN

## 2016-11-12 NOTE — Patient Instructions (Signed)
F/U as needed

## 2016-11-12 NOTE — Progress Notes (Signed)
   Subjective:    Patient ID: Stephen Booker, male    DOB: Jun 22, 1950, 66 y.o.   MRN: [id number]  Patient presents for ER F/U (cough/ L rib pain)  Patient here to follow-up patient  bronchitis.  Was seen in the ER after last visit chest x-ray was negative for pneumonia show bronchitis.  He was prescribed Levaquin but was unable to get due to being ill.  We were able to arrange home delivery of both prednisone and Levaquin.  He was never able to afford the albuterol inhaler as he did have some wheezing.  He states that his cough is now improved he still has some left-sided rib pain when he coughs but now his appetite has improved and the coughing has lessened.  He has 3 more days of antibiotic and the steroid left. Note he did not have the prescription filled from the emergency room for the pain medication as he is on a pain contract with my office.   Review Of Systems:  GEN- +fatigue,denies  fever, weight loss,weakness, recent illness HEENT- denies eye drainage, change in vision, nasal discharge, CVS- denies chest pain, palpitations RESP- denies SOB, +cough, +wheeze ABD- denies N/V, change in stools, abd pain GU- denies dysuria, hematuria, dribbling, incontinence MSK+joint pain, muscle aches, injury Neuro- denies headache, dizziness, syncope, seizure activity       Objective:    BP 138/82   Pulse 70   Temp 98.2 F (36.8 C) (Oral)   Resp 18   Ht 5\' 7"  (1.702 m)   Wt 179 lb (81.2 kg)   SpO2 95%   BMI 28.04 kg/m  GEN- NAD, alert and oriented x3 HEENT- PERRL, EOMI, non injected sclera, pink conjunctiva, MMM, oropharynx clear Neck- Supple, no LAD CVS- RRR, no murmur RESP-few scattered wheeze  Chest wall- TTP left side walls over ribs around T7/t8 EXT- No edema Pulses- Radial 2+        Assessment & Plan:     November pain script given today   Problem List Items Addressed This Visit    None    Visit Diagnoses    Bronchitis    -  Primary   Complete antibiotics, steroids,  given neb treatment in office to help wheeze, his cough and wheeze improved, but he still he can not afford inhaler and wants to hold off Continue OTC cough medicine. Normal oxygen sat  He has improved    Relevant Medications   albuterol (PROVENTIL) (2.5 MG/3ML) 0.083% nebulizer solution 2.5 mg (Completed)      Note: This dictation was prepared with Dragon dictation along with smaller phrase technology. Any transcriptional errors that result from this process are unintentional.

## 2016-12-09 ENCOUNTER — Telehealth: Payer: Self-pay | Admitting: *Deleted

## 2016-12-09 NOTE — Telephone Encounter (Signed)
He may have cracked a rib with all of the coughing This can take a couple months to heal. If the pain is severe, he can go get a rib series xray, but still treatment is time and pain medication which he  Has

## 2016-12-09 NOTE — Telephone Encounter (Signed)
Call placed to patient and patient made aware per VM.  

## 2016-12-09 NOTE — Telephone Encounter (Signed)
Received call from patient.   Reports that he continues to have tenderness to B ribs. Reports that L side is worse than R.   Patient has had CXR x2 with no acute finding with ribs. Cough has resolved.  MD please advise.

## 2016-12-23 ENCOUNTER — Other Ambulatory Visit: Payer: Self-pay | Admitting: *Deleted

## 2016-12-23 ENCOUNTER — Encounter: Payer: Self-pay | Admitting: Family Medicine

## 2016-12-23 ENCOUNTER — Telehealth: Payer: Self-pay | Admitting: *Deleted

## 2016-12-23 MED ORDER — OXYCODONE-ACETAMINOPHEN 5-325 MG PO TABS: 1.0000 | ORAL_TABLET | Freq: Three times a day (TID) | ORAL | 0 refills | Status: DC | PRN

## 2016-12-23 NOTE — Telephone Encounter (Signed)
Received call from patient.   Requested refill on Oxycodone.   Ok to refill??  Last office visit/ refill 11/12/2016.

## 2016-12-23 NOTE — Telephone Encounter (Signed)
Prescription printed and patient made aware to come to office to pick up on 12/24/2016.

## 2016-12-23 NOTE — Telephone Encounter (Signed)
Okay to refill? 

## 2017-01-27 ENCOUNTER — Other Ambulatory Visit: Payer: Self-pay | Admitting: *Deleted

## 2017-01-27 NOTE — Telephone Encounter (Signed)
Received call from patient.   Ok to refill??  Last office visit 11/12/2016.  Last refill 12/23/2016.

## 2017-01-28 MED ORDER — OXYCODONE-ACETAMINOPHEN 5-325 MG PO TABS: 1.0000 | ORAL_TABLET | Freq: Three times a day (TID) | ORAL | 0 refills | Status: DC | PRN

## 2017-01-28 NOTE — Telephone Encounter (Signed)
Computer system down RX printed, left up front and patient aware to pick up.

## 2017-02-10 ENCOUNTER — Ambulatory Visit (INDEPENDENT_AMBULATORY_CARE_PROVIDER_SITE_OTHER): Payer: Medicare HMO | Admitting: Family Medicine

## 2017-02-10 ENCOUNTER — Encounter: Payer: Self-pay | Admitting: Family Medicine

## 2017-02-10 ENCOUNTER — Other Ambulatory Visit: Payer: Self-pay

## 2017-02-10 VITALS — BP 126/70 | HR 70 | Temp 98.5°F | Resp 18 | Ht 67.0 in | Wt 181.0 lb

## 2017-02-10 DIAGNOSIS — I25708 Atherosclerosis of coronary artery bypass graft(s), unspecified, with other forms of angina pectoris: Secondary | ICD-10-CM

## 2017-02-10 DIAGNOSIS — E782 Mixed hyperlipidemia: Secondary | ICD-10-CM

## 2017-02-10 DIAGNOSIS — I10 Essential (primary) hypertension: Secondary | ICD-10-CM

## 2017-02-10 DIAGNOSIS — F5104 Psychophysiologic insomnia: Secondary | ICD-10-CM

## 2017-02-10 DIAGNOSIS — M19049 Primary osteoarthritis, unspecified hand: Secondary | ICD-10-CM

## 2017-02-10 DIAGNOSIS — E039 Hypothyroidism, unspecified: Secondary | ICD-10-CM | POA: Diagnosis not present

## 2017-02-10 DIAGNOSIS — M1711 Unilateral primary osteoarthritis, right knee: Secondary | ICD-10-CM

## 2017-02-10 DIAGNOSIS — G894 Chronic pain syndrome: Secondary | ICD-10-CM | POA: Diagnosis not present

## 2017-02-10 MED ORDER — DICLOFENAC SODIUM 1 % TD GEL: 2.0000 g | Freq: Four times a day (QID) | TRANSDERMAL | 3 refills | Status: DC

## 2017-02-10 NOTE — Assessment & Plan Note (Signed)
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

## 2017-02-10 NOTE — Assessment & Plan Note (Signed)
Will try melatonin

## 2017-02-10 NOTE — Assessment & Plan Note (Signed)
TFT at goal 

## 2017-02-10 NOTE — Progress Notes (Signed)
   Subjective:    Patient ID: Stephen Booker, male    DOB: [birth year], 67 y.o.   MRN: [id number]  Patient presents for Follow-up (is fasting) Patient here to follow-up chronic medical problems.  Medications reviewed Coronary artery disease he has not had any chest pain discomfort he does continue to follow with cardiology.  Thyroid is not taking his Synthroid as prescribed  HTN-  has been controlled on his medications he is taking daily  Pain syndrome he continues on his narcotic medication for his chronic back pain.  He is on Percocet.  Has osteoarthritis in his knees and in his hands  Medications reviewed  He still gets pain in right deltoid, aching pain, occurred after his shingles shot,no swelling , it comes and goes   Labs from Oct reviewed   Continues to have insomnia, trazodone did not help and some side effects of weird vivid dreams    Uses topical steroid for [place]   Review Of Systems:  GEN- denies fatigue, fever, weight loss,weakness, recent illness HEENT- denies eye drainage, change in vision, nasal discharge, CVS- denies chest pain, palpitations RESP- denies SOB, cough, wheeze ABD- denies N/V, change in stools, abd pain GU- denies dysuria, hematuria, dribbling, incontinence MSK- + joint pain, muscle aches, injury Neuro- denies headache, dizziness, syncope, seizure activity       Objective:    BP 126/70   Pulse 70   Temp 98.5 F (36.9 C) (Oral)   Resp 18   Ht 5\' 7"  (1.702 m)   Wt 181 lb (82.1 kg)   SpO2 97%   BMI 28.35 kg/m  GEN- NAD, alert and oriented x3 HEENT- PERRL, EOMI, non injected sclera, pink conjunctiva, MMM, oropharynx clear Neck- Supple, no thyromegaly CVS- RRR, no murmur RESP-CTAB ABD-NABS,soft,NT,ND Psych- normal affect and mood  EXT- No edema Pulses- Radial 2+        Assessment & Plan:      Problem List Items Addressed This Visit      Unprioritized   Hyperlipidemia   Relevant Orders   Comprehensive metabolic panel   Lipid  panel   Hypothyroidism   Hypertension   OA (osteoarthritis) of knee    TFT at goal      Chronic pain syndrome - Primary    Continue Percocet as prescribed we will also use a topical anti-inflammatory for his generalized osteoarthritis.      Chronic insomnia    Will try melatonin       CAD (coronary artery disease)    BP at goal, no angina symptoms F/u cardiology Check fasting labs today  On statin and ASA       Arthritis of hand    Generalized OA, will try topical Voltaren in setting of his CAD, already on narcotics  Can use on the deltoid as well, no sign of tear of ligmentatl strain,         Note: This dictation was prepared with Dragon dictation along with smaller phrase technology. Any transcriptional errors that result from this process are unintentional.

## 2017-02-10 NOTE — Patient Instructions (Addendum)
F/U 4 months for PHYSICAL  Try the melatonin for sleep

## 2017-02-10 NOTE — Assessment & Plan Note (Signed)
Continue Percocet as prescribed we will also use a topical anti-inflammatory for his generalized osteoarthritis.

## 2017-02-10 NOTE — Assessment & Plan Note (Signed)
BP at goal, no angina symptoms F/u cardiology Check fasting labs today  On statin and ASA

## 2017-02-11 LAB — COMPREHENSIVE METABOLIC PANEL
AG Ratio: 1.8 (calc) (ref 1.0–2.5)
ALBUMIN MSPROF: 4.9 g/dL (ref 3.6–5.1)
ALT: 30 U/L (ref 9–46)
AST: 27 U/L (ref 10–35)
Alkaline phosphatase (APISO): 70 U/L (ref 40–115)
BUN: 17 mg/dL (ref 7–25)
CO2: 29 mmol/L (ref 20–32)
CREATININE: 0.88 mg/dL (ref 0.70–1.25)
Calcium: 10.2 mg/dL (ref 8.6–10.3)
Chloride: 102 mmol/L (ref 98–110)
GLOBULIN: 2.7 g/dL (ref 1.9–3.7)
Glucose, Bld: 99 mg/dL (ref 65–99)
POTASSIUM: 4.6 mmol/L (ref 3.5–5.3)
SODIUM: 140 mmol/L (ref 135–146)
Total Bilirubin: 0.6 mg/dL (ref 0.2–1.2)
Total Protein: 7.6 g/dL (ref 6.1–8.1)

## 2017-02-11 LAB — LIPID PANEL
CHOL/HDL RATIO: 3.8 (calc) (ref <5.0)
CHOLESTEROL: 207 mg/dL — AB (ref <200)
HDL: 54 mg/dL (ref >40)
LDL Cholesterol (Calc): 117 mg/dL (calc) — ABNORMAL HIGH
NON-HDL CHOLESTEROL (CALC): 153 mg/dL — AB (ref <130)
Triglycerides: 238 mg/dL — ABNORMAL HIGH (ref <150)

## 2017-02-11 LAB — EXTRA LAV TOP TUBE

## 2017-02-16 ENCOUNTER — Ambulatory Visit: Payer: Medicare HMO | Admitting: Cardiovascular Disease

## 2017-02-16 ENCOUNTER — Encounter: Payer: Self-pay | Admitting: Cardiovascular Disease

## 2017-02-16 VITALS — BP 134/78 | HR 73 | Ht 67.0 in | Wt 184.0 lb

## 2017-02-16 DIAGNOSIS — E782 Mixed hyperlipidemia: Secondary | ICD-10-CM | POA: Diagnosis not present

## 2017-02-16 DIAGNOSIS — I25708 Atherosclerosis of coronary artery bypass graft(s), unspecified, with other forms of angina pectoris: Secondary | ICD-10-CM | POA: Diagnosis not present

## 2017-02-16 DIAGNOSIS — E039 Hypothyroidism, unspecified: Secondary | ICD-10-CM

## 2017-02-16 DIAGNOSIS — R5383 Other fatigue: Secondary | ICD-10-CM | POA: Diagnosis not present

## 2017-02-16 DIAGNOSIS — Z79899 Other long term (current) drug therapy: Secondary | ICD-10-CM | POA: Diagnosis not present

## 2017-02-16 DIAGNOSIS — I10 Essential (primary) hypertension: Secondary | ICD-10-CM | POA: Diagnosis not present

## 2017-02-16 NOTE — Progress Notes (Signed)
SUBJECTIVE: The patient presents for routine follow-up for coronary artery disease and dyslipidemia.  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  He struggled with viral bronchitis over the winter.  He has recovered from this but does complain of progressive fatigue.  He experienced this for months prior to viral bronchitis.  He has also developed bilateral sharp aching pains in his ankles, right greater than left, after starting statin therapy.  He also has some right arm soreness which he describes as a throbbing sensation.  Prior to undergoing bypass surgery, he had chest pain.  He denies this at present.  He denies palpitations, leg swelling, and orthopnea.  He said he eats a lot of hamburger.  He does not eat fish.  He tries to eat more fruit but does not eat vegetables.  He does not exercise like he should he says.   Social history: Born in [place], [place]. 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 He is divorced and has 2 daughters. He first moved to [place] and lived with his sister and thennmoved to [place] in late December 2017.  Review of Systems: As per "subjective", otherwise negative.  Allergies  Allergen Reactions  . Statins     Myalgia   . Penicillins Rash    Current Outpatient Medications  Medication Sig Dispense Refill  . amLODipine (NORVASC) 5 MG tablet Take 1 tablet (5 mg total) by mouth daily. 30 tablet 3  . aspirin EC 81 MG tablet Take 81 mg by mouth daily.    . diazepam (VALIUM) 5 MG tablet Take 1 hour before flight/rides 10 tablet 0  . diclofenac sodium (VOLTAREN) 1 % GEL Apply 2 g topically 4 (four) times daily. Prn to affected areas 1 Tube 3  . gemfibrozil (LOPID) 600 MG tablet Take 1 tablet (600 mg total) by mouth 2 (two) times daily before a meal. 180 tablet 3  . levothyroxine (SYNTHROID, LEVOTHROID) 50 MCG tablet Take 1 tablet (50 mcg total) daily before breakfast by mouth. 30 tablet 6  . metoprolol  succinate (TOPROL-XL) 100 MG 24 hr tablet Take 1 tablet (100 mg total) by mouth daily. Take with or immediately following a meal. 90 tablet 3  . niacin 500 MG CR capsule Take 500 mg by mouth 3 (three) times daily.    . nitroGLYCERIN (NITROSTAT) 0.4 MG SL tablet Place 1 tablet (0.4 mg total) under the tongue every 5 (five) minutes as needed for chest pain. 25 tablet 3  . oxyCODONE-acetaminophen (PERCOCET/ROXICET) 5-325 MG tablet Take 1 tablet by mouth every 8 (eight) hours as needed. 90 tablet 0  . Papav-Phentolamine-Alprostadil 12-1-0.01 MG/ML SOLN by Intracavernosal route.    . rosuvastatin (CRESTOR) 5 MG tablet Take 1 tablet (5 mg total) by mouth daily. 90 tablet 3   No current facility-administered medications for this visit.     Past Medical History:  Diagnosis Date  . Arthritis   . Back pain   . Coronary artery disease   . Diverticulitis   . Hyperlipidemia   . Hypertension   . S/P triple vessel bypass   . Thyroid disease     Past Surgical History:  Procedure Laterality Date  . CORONARY ARTERY BYPASS GRAFT    . INGUINAL HERNIA REPAIR    . KNEE ARTHROSCOPY Right    x2  . UMBILICAL HERNIA REPAIR      Social History   Socioeconomic History  . Marital status: Divorced  Spouse name: Not on file  . Number of children: Not on file  . Years of education: Not on file  . Highest education level: Not on file  Social Needs  . Financial resource strain: Not on file  . Food insecurity - worry: Not on file  . Food insecurity - inability: Not on file  . Transportation needs - medical: Not on file  . Transportation needs - non-medical: Not on file  Occupational History  . Not on file  Tobacco Use  . Smoking status: Former Research scientist (life sciences)  . Smokeless tobacco: Never Used  Substance and Sexual Activity  . Alcohol use: No  . Drug use: No  . Sexual activity: Not Currently  Other Topics Concern  . Not on file  Social History Narrative  . Not on file     Vitals:   02/16/17 1245    BP: 134/78  Pulse: 73  SpO2: 95%  Weight: 184 lb (83.5 kg)  Height: 5\' 7"  (1.702 m)    Wt Readings from Last 3 Encounters:  02/16/17 184 lb (83.5 kg)  02/10/17 181 lb (82.1 kg)  11/12/16 179 lb (81.2 kg)     PHYSICAL EXAM General: NAD HEENT: Normal. Neck: No JVD, no thyromegaly. Lungs: Clear to auscultation bilaterally with normal respiratory effort. CV: Regular rate and rhythm, normal S1/S2, no S3/S4, no murmur. No pretibial or periankle edema.  No carotid bruit.   Abdomen: Soft, nontender, no distention.  Neurologic: Alert and oriented.  Psych: Normal affect. Skin: Normal. Musculoskeletal: No gross deformities.    ECG: Most recent ECG reviewed.   Labs: Lab Results  Component Value Date/Time   K 4.6 02/10/2017 02:25 PM   BUN 17 02/10/2017 02:25 PM   CREATININE 0.88 02/10/2017 02:25 PM   ALT 30 02/10/2017 02:25 PM   TSH 1.00 10/10/2016 02:55 PM   HGB 13.7 11/08/2016 04:01 PM     Lipids: Lab Results  Component Value Date/Time   LDLCALC 73 09/10/2016 09:29 AM   CHOL 207 (H) 02/10/2017 02:25 PM   TRIG 238 (H) 02/10/2017 02:25 PM   HDL 54 02/10/2017 02:25 PM       ASSESSMENT AND PLAN: 1. CAD with history of 3-vessel CABG in 05/2012 with progressive fatigue:I will obtain an exercise Myoview stress test to evaluate for an ischemic etiology of his symptoms.. Left ventricular systolic function is normal, LVEF 65%.  Continue aspirin, Toprol-XL 100 mg daily, and Crestor 10 mg.  2. Mixed dyslipidemia:  Lipid panel 02/10/17 showed total cholesterol 207, HDL 54, triglycerides 238, LDL 117.  He has developed statin intolerance.  I will start Dr[place].  3.  Hypothyroidism: Remains on Synthroid.  4.  Hypertension: Controlled on present therapy which includes metoprolol succinate and amlodipine.  No changes.      Disposition: Follow up 6-8 weeks   Kate Sable, M.D., F.A.C.C.

## 2017-02-16 NOTE — Patient Instructions (Addendum)
Your physician wants you to follow-up in:6-8 weeks with [facility]      Your physician has requested that you have en exercise stress myoview. For further information please visit [email or web address]. Please follow instruction sheet, as given.HOLD METOPROLOL THE MORNING OF TEST       STOP Crestor   Please START Repatha I will send to specialty pharmacy    No lab work today      Thank you for choosing [facility] !

## 2017-02-18 ENCOUNTER — Telehealth: Payer: Self-pay | Admitting: Cardiovascular Disease

## 2017-02-18 NOTE — Telephone Encounter (Signed)
I appreciate the update. He should do well with Repatha.

## 2017-02-18 NOTE — Telephone Encounter (Signed)
Returned pt call. He wanted to inform Dr. Bronson Ing that he has had no pain since stopping crestor several days ago. Will forward to Dr. As an Stephen Booker:

## 2017-02-20 ENCOUNTER — Other Ambulatory Visit: Payer: Self-pay

## 2017-02-20 MED ORDER — EVOLOCUMAB 140 MG/ML ~~LOC~~ SOSY: 140.0000 mg | PREFILLED_SYRINGE | SUBCUTANEOUS | 11 refills | Status: DC

## 2017-02-20 NOTE — Telephone Encounter (Signed)
[facility]  No longer takes new referral for Repatha.Told to use [facility] pharmacy , e-scribed to [employer], pt aware

## 2017-02-25 ENCOUNTER — Encounter (HOSPITAL_COMMUNITY)
Admission: RE | Admit: 2017-02-25 | Discharge: 2017-02-25 | Disposition: A | Discharge status: Home / Self Care | Payer: Medicare HMO | Source: Ambulatory Visit | Attending: Cardiovascular Disease | Admitting: Cardiovascular Disease

## 2017-02-25 ENCOUNTER — Encounter (HOSPITAL_BASED_OUTPATIENT_CLINIC_OR_DEPARTMENT_OTHER)
Admission: RE | Admit: 2017-02-25 | Discharge: 2017-02-25 | Disposition: A | Discharge status: Home / Self Care | Payer: Medicare HMO | Source: Ambulatory Visit | Attending: Cardiovascular Disease | Admitting: Cardiovascular Disease

## 2017-02-25 ENCOUNTER — Other Ambulatory Visit: Payer: Self-pay | Admitting: *Deleted

## 2017-02-25 ENCOUNTER — Encounter (HOSPITAL_COMMUNITY): Payer: Self-pay

## 2017-02-25 DIAGNOSIS — R0602 Shortness of breath: Secondary | ICD-10-CM | POA: Insufficient documentation

## 2017-02-25 DIAGNOSIS — R5383 Other fatigue: Secondary | ICD-10-CM | POA: Diagnosis not present

## 2017-02-25 DIAGNOSIS — R42 Dizziness and giddiness: Secondary | ICD-10-CM | POA: Diagnosis not present

## 2017-02-25 LAB — NM MYOCAR MULTI W/SPECT W/WALL MOTION / EF
CHL CUP NUCLEAR SDS: 0
CHL CUP RESTING HR STRESS: 57 {beats}/min
CSEPEDS: 35 s
CSEPPHR: 144 {beats}/min
Estimated workload: 7 METS
Exercise duration (min): 4 min
LVDIAVOL: 77 mL (ref 62–150)
LVSYSVOL: 28 mL
MPHR: 152 {beats}/min
Percent HR: 94 %
RATE: 0.42
RPE: 15
SRS: 1
SSS: 1
TID: 1.02

## 2017-02-25 MED ORDER — OXYCODONE-ACETAMINOPHEN 5-325 MG PO TABS: 1.0000 | ORAL_TABLET | Freq: Three times a day (TID) | ORAL | 0 refills | Status: DC | PRN

## 2017-02-25 MED ORDER — TECHNETIUM TC 99M TETROFOSMIN IV KIT
30.0000 | PACK | Freq: Once | INTRAVENOUS | Status: AC | PRN
  Administered 2017-02-25: 32 via INTRAVENOUS

## 2017-02-25 MED ORDER — REGADENOSON 0.4 MG/5ML IV SOLN
INTRAVENOUS | Status: AC
  Filled 2017-02-25: qty 5

## 2017-02-25 MED ORDER — TECHNETIUM TC 99M TETROFOSMIN IV KIT
10.0000 | PACK | Freq: Once | INTRAVENOUS | Status: AC | PRN
  Administered 2017-02-25: 11 via INTRAVENOUS

## 2017-02-25 MED ORDER — SODIUM CHLORIDE 0.9% FLUSH
INTRAVENOUS | Status: AC
  Administered 2017-02-25: 10 mL via INTRAVENOUS
  Filled 2017-02-25: qty 160

## 2017-02-25 NOTE — Addendum Note (Signed)
Addended by: Sheral Flow on: 02/25/2017 04:54 PM   Modules accepted: Orders

## 2017-02-25 NOTE — Telephone Encounter (Signed)
Received VM from patient on 02/24/2017 @ 1:39pm.   Requested refill on Oxycodone/ APAP.   Ok to refill??  Last office visit 02/10/2017.  Last refill 01/28/2017.

## 2017-03-03 ENCOUNTER — Telehealth: Payer: Self-pay | Admitting: Pharmacist Clinician (PhC)/ Clinical Pharmacy Specialist

## 2017-03-03 NOTE — Telephone Encounter (Signed)
Spoke with patient regarding past history of statin use.  He has tried and failed rosuvastatin recently, but we had no other records, as patient moved here from [place] in 2017.   Prior to that he lived in [place].   While living in [place] patient was tried on Lipitor.  He cannot recall the strength, but knows is was around 2006 or 2007.  He took the medication for just a couple of months before he had to discontinue due to sever joint pain.  After that he did not rechallenge with another statin until seeing Dr. Bronson Ing here in [place].

## 2017-03-11 ENCOUNTER — Emergency Department (HOSPITAL_COMMUNITY)
Admission: EM | Admit: 2017-03-11 | Discharge: 2017-03-11 | Disposition: A | Discharge status: Home / Self Care | Payer: Medicare HMO | Attending: Emergency Medicine | Admitting: Emergency Medicine

## 2017-03-11 ENCOUNTER — Encounter (HOSPITAL_COMMUNITY): Payer: Self-pay | Admitting: Emergency Medicine

## 2017-03-11 ENCOUNTER — Other Ambulatory Visit: Payer: Self-pay

## 2017-03-11 ENCOUNTER — Emergency Department (HOSPITAL_COMMUNITY): Payer: Medicare HMO

## 2017-03-11 DIAGNOSIS — I2510 Atherosclerotic heart disease of native coronary artery without angina pectoris: Secondary | ICD-10-CM | POA: Diagnosis not present

## 2017-03-11 DIAGNOSIS — E039 Hypothyroidism, unspecified: Secondary | ICD-10-CM | POA: Insufficient documentation

## 2017-03-11 DIAGNOSIS — R51 Headache: Secondary | ICD-10-CM | POA: Diagnosis not present

## 2017-03-11 DIAGNOSIS — Z79899 Other long term (current) drug therapy: Secondary | ICD-10-CM | POA: Insufficient documentation

## 2017-03-11 DIAGNOSIS — Z87891 Personal history of nicotine dependence: Secondary | ICD-10-CM | POA: Insufficient documentation

## 2017-03-11 DIAGNOSIS — I10 Essential (primary) hypertension: Secondary | ICD-10-CM

## 2017-03-11 DIAGNOSIS — J0100 Acute maxillary sinusitis, unspecified: Secondary | ICD-10-CM | POA: Insufficient documentation

## 2017-03-11 DIAGNOSIS — Z951 Presence of aortocoronary bypass graft: Secondary | ICD-10-CM | POA: Insufficient documentation

## 2017-03-11 LAB — BASIC METABOLIC PANEL
Anion gap: 12 (ref 5–15)
BUN: 20 mg/dL (ref 6–20)
CHLORIDE: 101 mmol/L (ref 101–111)
CO2: 24 mmol/L (ref 22–32)
Calcium: 9.1 mg/dL (ref 8.9–10.3)
Creatinine, Ser: 0.78 mg/dL (ref 0.61–1.24)
GFR calc non Af Amer: >60 mL/min (ref >60)
Glucose, Bld: 122 mg/dL — ABNORMAL HIGH (ref 65–99)
Potassium: 3.8 mmol/L (ref 3.5–5.1)
Sodium: 137 mmol/L (ref 135–145)

## 2017-03-11 LAB — TROPONIN I: Troponin I: <0.03 ng/mL (ref <0.03)

## 2017-03-11 LAB — CBC WITH DIFFERENTIAL/PLATELET
Basophils Absolute: 0 10*3/uL (ref 0.0–0.1)
Basophils Relative: 1 %
Eosinophils Absolute: 0.1 10*3/uL (ref 0.0–0.7)
Eosinophils Relative: 4 %
HEMATOCRIT: 40 % (ref 39.0–52.0)
HEMOGLOBIN: 13.6 g/dL (ref 13.0–17.0)
LYMPHS ABS: 1.7 10*3/uL (ref 0.7–4.0)
Lymphocytes Relative: 42 %
MCH: 28.8 pg (ref 26.0–34.0)
MCHC: 34 g/dL (ref 30.0–36.0)
MCV: 84.6 fL (ref 78.0–100.0)
Monocytes Absolute: 0.5 10*3/uL (ref 0.1–1.0)
Monocytes Relative: 12 %
NEUTROS ABS: 1.6 10*3/uL — AB (ref 1.7–7.7)
Neutrophils Relative %: 41 %
Platelets: 260 10*3/uL (ref 150–400)
RBC: 4.73 MIL/uL (ref 4.22–5.81)
RDW: 12.6 % (ref 11.5–15.5)
WBC: 3.9 10*3/uL — ABNORMAL LOW (ref 4.0–10.5)

## 2017-03-11 LAB — SEDIMENTATION RATE: SED RATE: 25 mm/h — AB (ref 0–16)

## 2017-03-11 MED ORDER — AMLODIPINE BESYLATE 5 MG PO TABS: 5.0000 mg | ORAL_TABLET | Freq: Once | ORAL | Status: DC

## 2017-03-11 MED ORDER — DOXYCYCLINE HYCLATE 100 MG PO CAPS: 100.0000 mg | ORAL_CAPSULE | Freq: Two times a day (BID) | ORAL | 0 refills | Status: DC

## 2017-03-11 NOTE — ED Notes (Signed)
Pt alert & oriented x4, stable gait. Patient given discharge instructions, paperwork & prescription(s). Patient  instructed to stop at the registration desk to finish any additional paperwork. Patient verbalized understanding. Pt left department w/ no further questions. 

## 2017-03-11 NOTE — ED Notes (Signed)
Pt states he woke tonight w/ a headache. Checked his BP at home & was elevated. Complains of pain to the right side of his head.

## 2017-03-11 NOTE — ED Triage Notes (Signed)
Pt c/o headache tonight and states his blood pressure has been running high (180/98, 168/95,180/100, 190/102).

## 2017-03-11 NOTE — Discharge Instructions (Signed)
Keep a record of your blood pressure at the same time every day.  Ask your doctor whether you need to be on additional blood pressure medication such as amlodipine.  Return to the ED if you develop chest pain, shortness of breath, worsening headache, difficulty speaking, difficulty swallowing, focal weakness or any other concern.

## 2017-03-11 NOTE — ED Provider Notes (Signed)
[facility] EMERGENCY DEPARTMENT Provider Note   CSN: [id number] Arrival date & time: 03/11/17  0304     History   Chief Complaint Chief Complaint  Patient presents with  . Headache    HPI Stephen Booker is a 67 y.o. male.  Patient with history of CAD status post CABG in May 2014, hypercholesterolemia, chronic back pain and hypertension presenting from home with headache.  States developed intermittent right-sided headache about midnight while at home.  States the pain comes and goes and is gradual in onset.  This concerned him so he began checking his blood pressure and has been running in the 160-180/100 range over the past several hours.  He says this is very unusual for him.  States compliance with his metoprolol which he takes for blood pressure in the morning.  Denies any sudden onset headache.  Denies any focal weakness, numbness or tingling.  No blurry vision.  No photophobia or phonophobia.  No chest pain or shortness of breath.  No visual changes. No dizziness or lightheadedness. He has recently been using nasal sprays for sinus problems.   The history is provided by the patient.  Headache   Pertinent negatives include no fever, no shortness of breath, no nausea and no vomiting.    Past Medical History:  Diagnosis Date  . Arthritis   . Back pain   . Coronary artery disease   . Diverticulitis   . Hyperlipidemia   . Hypertension   . S/P triple vessel bypass   . Thyroid disease     Patient Active Problem List   Diagnosis Date Noted  . Hypertension 10/10/2016  . ED (erectile dysfunction) 06/10/2016  . Hyperlipidemia 06/09/2016  . Chronic insomnia 06/09/2016  . Anxiety with depression 06/09/2016  . CAD (coronary artery disease) 03/07/2016  . Diverticulosis 03/07/2016  . Chronic pain syndrome 03/07/2016  . Lumbar spondylosis 03/07/2016  . Hypothyroidism 03/07/2016  . OA (osteoarthritis) of knee 03/07/2016  . Arthritis of hand 03/07/2016    Past Surgical History:   Procedure Laterality Date  . CORONARY ARTERY BYPASS GRAFT    . INGUINAL HERNIA REPAIR    . KNEE ARTHROSCOPY Right    x2  . UMBILICAL HERNIA REPAIR         Home Medications    Prior to Admission medications   Medication Sig Start Date End Date Taking? Authorizing Provider  amLODipine (NORVASC) 5 MG tablet Take 1 tablet (5 mg total) by mouth daily. 10/10/16   Alycia Rossetti, MD  aspirin EC 81 MG tablet Take 81 mg by mouth daily.    [provider]  diazepam (VALIUM) 5 MG tablet Take 1 hour before flight/rides 06/09/16   [place], Modena Nunnery, MD  diclofenac sodium (VOLTAREN) 1 % GEL Apply 2 g topically 4 (four) times daily. Prn to affected areas 02/10/17   [place], Modena Nunnery, MD  Evolocumab (REPATHA) 140 MG/ML SOSY Inject 140 mg into the skin as directed. 02/20/17   Herminio Commons, MD  gemfibrozil (LOPID) 600 MG tablet Take 1 tablet (600 mg total) by mouth 2 (two) times daily before a meal. 07/04/16   Herminio Commons, MD  levothyroxine (SYNTHROID, LEVOTHROID) 50 MCG tablet Take 1 tablet (50 mcg total) daily before breakfast by mouth. 11/10/16   Alycia Rossetti, MD  metoprolol succinate (TOPROL-XL) 100 MG 24 hr tablet Take 1 tablet (100 mg total) by mouth daily. Take with or immediately following a meal. 07/04/16   Herminio Commons, MD  niacin  500 MG CR capsule Take 500 mg by mouth 3 (three) times daily.    [provider]  nitroGLYCERIN (NITROSTAT) 0.4 MG SL tablet Place 1 tablet (0.4 mg total) under the tongue every 5 (five) minutes as needed for chest pain. 04/09/16   Herminio Commons, MD  oxyCODONE-acetaminophen (PERCOCET/ROXICET) 5-325 MG tablet Take 1 tablet by mouth every 8 (eight) hours as needed. 02/25/17   Alycia Rossetti, MD  Papav-Phentolamine-Alprostadil 12-1-0.01 MG/ML SOLN by Intracavernosal route.    [provider]    Family History Family History  Problem Relation Age of Onset  . Arthritis Mother   . Heart disease Mother   .  Cancer Father   . Early death Brother   . Cancer Brother        oral cancer  . COPD Brother   . Arthritis Sister   . Drug abuse Sister   . Early death Sister   . Cancer Maternal Aunt        Colon cancer     Social History Social History   Tobacco Use  . Smoking status: Former Research scientist (life sciences)  . Smokeless tobacco: Never Used  Substance Use Topics  . Alcohol use: No  . Drug use: No     Allergies   Statins and Penicillins   Review of Systems Review of Systems  Constitutional: Negative for activity change, appetite change and fever.  HENT: Negative for congestion.   Eyes: Negative for photophobia and visual disturbance.  Respiratory: Negative for cough, chest tightness and shortness of breath.   Cardiovascular: Negative for chest pain and leg swelling.  Gastrointestinal: Negative for abdominal pain, nausea and vomiting.  Genitourinary: Negative for dysuria, hematuria, scrotal swelling and testicular pain.  Musculoskeletal: Negative for arthralgias and myalgias.  Skin: Negative for rash.  Neurological: Positive for headaches. Negative for dizziness, tremors, speech difficulty, weakness, light-headedness and numbness.   all other systems are negative except as noted in the HPI and PMH.     Physical Exam Updated Vital Signs BP (!) 172/96   Pulse 64   Temp 97.9 F (36.6 C) (Oral)   Resp 20   Ht _0  (1.702 m)   Wt 81.6 kg (180 lb)   SpO2 96%   BMI 28.19 kg/m   Physical Exam  Constitutional: He is oriented to person, place, and time. He appears well-developed and well-nourished. No distress.  HENT:  Head: Normocephalic and atraumatic.  Mouth/Throat: Oropharynx is clear and moist. No oropharyngeal exudate.  No temporal artery tenderness  Eyes: Conjunctivae and EOM are normal. Pupils are equal, round, and reactive to light.  Neck: Normal range of motion. Neck supple.  No meningismus.  Cardiovascular: Normal rate, regular rhythm, normal heart sounds and intact distal  pulses.  No murmur heard. Pulmonary/Chest: Effort normal and breath sounds normal. No respiratory distress.  Abdominal: Soft. There is no tenderness. There is no rebound and no guarding.  Musculoskeletal: Normal range of motion. He exhibits no edema or tenderness.  Neurological: He is alert and oriented to person, place, and time. No cranial nerve deficit. He exhibits normal muscle tone. Coordination normal.  CN 2-12 intact, no ataxia on finger to nose, no nystagmus, 5/5 strength throughout, no pronator drift, Romberg negative, normal gait.   Skin: Skin is warm.  Psychiatric: He has a normal mood and affect. His behavior is normal.  Nursing note and vitals reviewed.    ED Treatments / Results  Labs (all labs ordered are listed, but only abnormal results are  displayed) Labs Reviewed  CBC WITH DIFFERENTIAL/PLATELET - Abnormal; Notable for the following components:      Result Value   WBC 3.9 (*)    Neutro Abs 1.6 (*)    All other components within normal limits  BASIC METABOLIC PANEL - Abnormal; Notable for the following components:   Glucose, Bld 122 (*)    All other components within normal limits  SEDIMENTATION RATE - Abnormal; Notable for the following components:   Sed Rate 25 (*)    All other components within normal limits  TROPONIN I    EKG  EKG Interpretation  Date/Time:  Wednesday March 11 2017 03:13:24 EST Ventricular Rate:  65 PR Interval:    QRS Duration: 103 QT Interval:  421 QTC Calculation: 438 R Axis:   45 Text Interpretation:  Sinus rhythm RSR' in V1 or V2, right VCD or RVH Abnormal inferior Q waves No previous ECGs available Confirmed by Ezequiel Essex [phone]) on 03/11/2017 3:27:27 AM       Radiology Ct Head Wo Contrast  Result Date: 03/11/2017 CLINICAL DATA:  Headache, chronic, neuro deficit. Patient reports he woke with headache. Elevated blood pressure. EXAM: CT HEAD WITHOUT CONTRAST TECHNIQUE: Contiguous axial images were obtained from the base of  the skull through the vertex without intravenous contrast. COMPARISON:  None. FINDINGS: Brain: Patchy deep low-density white matter hypodensity consistent with chronic small vessel ischemia. No intracranial hemorrhage, mass effect, or midline shift. No hydrocephalus. The basilar cisterns are patent. No evidence of territorial infarct or acute ischemia. No extra-axial or intracranial fluid collection. Vascular: Atherosclerosis of skullbase vasculature without hyperdense vessel or abnormal calcification. Skull: No fracture or focal lesion. Sinuses/Orbits: Opacification of ethmoid air cells and mucosal thickening of bilateral maxillary sinuses and some intraluminal debris. Mastoid air cells are clear. Other: None. IMPRESSION: 1.  No acute intracranial abnormality. 2. Ethmoid and maxillary sinus inflammation. Electronically Signed   By: Jeb Levering M.D.   On: 03/11/2017 04:02    Procedures Procedures (including critical care time)  Medications Ordered in ED Medications - No data to display   Initial Impression / Assessment and Plan / ED Course  I have reviewed the triage vital signs and the nursing notes.  Pertinent labs & imaging results that were available during my care of the patient were reviewed by me and considered in my medical decision making (see chart for details).    Gradual onset right-sided headache in setting of hypertension.  No focal neurological deficits.  No chest pain.  EKG is unchanged.  Low suspicion for subarachnoid hemorrhage, meningitis, temporal arteritis.  On medication review, patient has amlodipine listed as well as metoprolol.  He states this amlodipine was prescribed by his PCP but he never initiated it.  At his PCP visit and cardiology visit in February, his blood pressure was in the 130 range.   Labs are reassuring. ESR wnl. No evidence of endorgan damage.  Blood pressure has improved without treatment to 138/78.  CT scan is negative for any acute process.   Sinusitis noted and may be contributing patient's headache.  Patient declines any medication for his headache.  His neurological exam is nonfocal.  He is tolerating p.o. and ambulatory.  Advised to monitor his blood pressure carefully and follow-up with his doctor for further medication adjustments.  Return precautions discussed. Final Clinical Impressions(s) / ED Diagnoses   Final diagnoses:  Essential hypertension  Acute maxillary sinusitis, recurrence not specified    ED Discharge Orders    None  Ezequiel Essex, MD 03/11/17 [phone]

## 2017-03-24 ENCOUNTER — Other Ambulatory Visit: Payer: Self-pay | Admitting: *Deleted

## 2017-03-24 MED ORDER — OXYCODONE-ACETAMINOPHEN 5-325 MG PO TABS: 1.0000 | ORAL_TABLET | Freq: Three times a day (TID) | ORAL | 0 refills | Status: DC | PRN

## 2017-03-24 NOTE — Telephone Encounter (Signed)
Received call from patient.   Requested refill on Oxycodone/APAP.   Ok to refill??  Last office visit 02/10/2017.  Last refill 02/25/2017.

## 2017-03-25 ENCOUNTER — Encounter: Payer: Self-pay | Admitting: Cardiovascular Disease

## 2017-04-01 ENCOUNTER — Telehealth: Payer: Self-pay | Admitting: Pharmacist Clinician (PhC)/ Clinical Pharmacy Specialist

## 2017-04-01 ENCOUNTER — Other Ambulatory Visit: Payer: Self-pay | Admitting: Pharmacist Clinician (PhC)/ Clinical Pharmacy Specialist

## 2017-04-01 MED ORDER — EVOLOCUMAB 140 MG/ML ~~LOC~~ SOSY: 140.0000 mg | PREFILLED_SYRINGE | SUBCUTANEOUS | 11 refills | Status: DC

## 2017-04-01 NOTE — Telephone Encounter (Signed)
LMOM for patient to return call.  Repatha approved, need to know which local pharmacy (other than [facility]) we can send Rx to

## 2017-04-23 ENCOUNTER — Other Ambulatory Visit: Payer: Self-pay | Admitting: *Deleted

## 2017-04-23 MED ORDER — OXYCODONE-ACETAMINOPHEN 5-325 MG PO TABS: 1.0000 | ORAL_TABLET | Freq: Three times a day (TID) | ORAL | 0 refills | Status: DC | PRN

## 2017-04-23 NOTE — Telephone Encounter (Signed)
Received call from patient.   Requested refill on Oxycodone/APAP.   Ok to refill??  Last office visit 02/10/2017.  Last refill 03/18/2017

## 2017-04-24 ENCOUNTER — Ambulatory Visit: Payer: Medicare HMO | Admitting: Cardiovascular Disease

## 2017-05-06 ENCOUNTER — Ambulatory Visit (INDEPENDENT_AMBULATORY_CARE_PROVIDER_SITE_OTHER): Payer: Medicare HMO | Admitting: Cardiovascular Disease

## 2017-05-06 ENCOUNTER — Encounter: Payer: Self-pay | Admitting: Cardiovascular Disease

## 2017-05-06 VITALS — BP 126/74 | HR 77 | Ht 67.0 in | Wt 185.0 lb

## 2017-05-06 DIAGNOSIS — E782 Mixed hyperlipidemia: Secondary | ICD-10-CM | POA: Diagnosis not present

## 2017-05-06 DIAGNOSIS — I25708 Atherosclerosis of coronary artery bypass graft(s), unspecified, with other forms of angina pectoris: Secondary | ICD-10-CM

## 2017-05-06 DIAGNOSIS — Z789 Other specified health status: Secondary | ICD-10-CM

## 2017-05-06 DIAGNOSIS — I10 Essential (primary) hypertension: Secondary | ICD-10-CM | POA: Diagnosis not present

## 2017-05-06 DIAGNOSIS — E039 Hypothyroidism, unspecified: Secondary | ICD-10-CM | POA: Diagnosis not present

## 2017-05-06 MED ORDER — PITAVASTATIN CALCIUM 2 MG PO TABS: 2.0000 mg | ORAL_TABLET | Freq: Every day | ORAL | 6 refills | Status: DC

## 2017-05-06 NOTE — Addendum Note (Signed)
Addended by: Debbora Lacrosse R on: 05/06/2017 01:39 PM   Modules accepted: Orders

## 2017-05-06 NOTE — Progress Notes (Addendum)
SUBJECTIVE: The patient presents for routine follow-up for coronary artery disease and dyslipidemia. 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  There was no ischemia by nuclear stress testing on 02/25/2017.  [facility] treadmill score was low.  LVEF 64%.  He is feeling well and denies chest pain, palpitations, leg swelling, and exertional dyspnea.  He has not received Repatha yet and is trying to get assistance from [employer].    Social history: Born in [place], [place]. 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 He is divorced and has 2 daughters. He first moved to [place] and lived with his sister and thenmoved to [place] in late December 2017.  He has a brother-in-law who is an Magazine features editor in [place], [place].   Review of Systems: As per "subjective", otherwise negative.  Allergies  Allergen Reactions  . Statins     Myalgia   . Penicillins Rash    Current Outpatient Medications  Medication Sig Dispense Refill  . amLODipine (NORVASC) 5 MG tablet Take 1 tablet (5 mg total) by mouth daily. 30 tablet 3  . aspirin EC 81 MG tablet Take 81 mg by mouth daily.    . diazepam (VALIUM) 5 MG tablet Take 1 hour before flight/rides 10 tablet 0  . diclofenac sodium (VOLTAREN) 1 % GEL Apply 2 g topically 4 (four) times daily. Prn to affected areas 1 Tube 3  . gemfibrozil (LOPID) 600 MG tablet Take 1 tablet (600 mg total) by mouth 2 (two) times daily before a meal. 180 tablet 3  . levothyroxine (SYNTHROID, LEVOTHROID) 50 MCG tablet Take 1 tablet (50 mcg total) daily before breakfast by mouth. 30 tablet 6  . metoprolol succinate (TOPROL-XL) 100 MG 24 hr tablet Take 1 tablet (100 mg total) by mouth daily. Take with or immediately following a meal. 90 tablet 3  . niacin 500 MG CR capsule Take 500 mg by mouth 3 (three) times daily.    . nitroGLYCERIN (NITROSTAT) 0.4 MG SL tablet Place 1 tablet (0.4 mg total) under the tongue every 5  (five) minutes as needed for chest pain. 25 tablet 3  . oxyCODONE-acetaminophen (PERCOCET/ROXICET) 5-325 MG tablet Take 1 tablet by mouth every 8 (eight) hours as needed. 90 tablet 0  . Papav-Phentolamine-Alprostadil 12-1-0.01 MG/ML SOLN by Intracavernosal route.    . Evolocumab (REPATHA) 140 MG/ML SOSY Inject 140 mg into the skin as directed. (Patient not taking: Reported on 05/06/2017) 2 Syringe 11   No current facility-administered medications for this visit.     Past Medical History:  Diagnosis Date  . Arthritis   . Back pain   . Coronary artery disease   . Diverticulitis   . Hyperlipidemia   . Hypertension   . S/P triple vessel bypass   . Thyroid disease     Past Surgical History:  Procedure Laterality Date  . CORONARY ARTERY BYPASS GRAFT    . INGUINAL HERNIA REPAIR    . KNEE ARTHROSCOPY Right    x2  . UMBILICAL HERNIA REPAIR      Social History   Socioeconomic History  . Marital status: Divorced    Spouse name: Not on file  . Number of children: Not on file  . Years of education: Not on file  . Highest education level: Not on file  Occupational History  . Not on file  Social Needs  . Financial resource strain: Not on file  . Food insecurity:  Worry: Not on file    Inability: Not on file  . Transportation needs:    Medical: Not on file    Non-medical: Not on file  Tobacco Use  . Smoking status: Former Smoker    Last attempt to quit: 01/06/1985    Years since quitting: 32.3  . Smokeless tobacco: Never Used  Substance and Sexual Activity  . Alcohol use: No  . Drug use: No  . Sexual activity: Not Currently  Lifestyle  . Physical activity:    Days per week: Not on file    Minutes per session: Not on file  . Stress: Not on file  Relationships  . Social connections:    Talks on phone: Not on file    Gets together: Not on file    Attends religious service: Not on file    Active member of club or organization: Not on file    Attends meetings of clubs or  organizations: Not on file    Relationship status: Not on file  . Intimate partner violence:    Fear of current or ex partner: Not on file    Emotionally abused: Not on file    Physically abused: Not on file    Forced sexual activity: Not on file  Other Topics Concern  . Not on file  Social History Narrative  . Not on file     Vitals:   05/06/17 1303  BP: 126/74  Pulse: 77  SpO2: 97%  Weight: 185 lb (83.9 kg)  Height: 5\' 7"  (1.702 m)    Wt Readings from Last 3 Encounters:  05/06/17 185 lb (83.9 kg)  03/11/17 180 lb (81.6 kg)  02/16/17 184 lb (83.5 kg)     PHYSICAL EXAM General: NAD HEENT: Normal. Neck: No JVD, no thyromegaly. Lungs: Clear to auscultation bilaterally with normal respiratory effort. CV: Regular rate and rhythm, normal S1/S2, no S3/S4, no murmur. No pretibial or periankle edema.  No carotid bruit.   Abdomen: Soft, nontender, no distention.  Neurologic: Alert and oriented.  Psych: Normal affect. Skin: Normal. Musculoskeletal: No gross deformities.    ECG: Most recent ECG reviewed.   Labs: Lab Results  Component Value Date/Time   K 3.8 03/11/2017 03:46 AM   BUN 20 03/11/2017 03:46 AM   CREATININE 0.78 03/11/2017 03:46 AM   CREATININE 0.88 02/10/2017 02:25 PM   ALT 30 02/10/2017 02:25 PM   TSH 1.00 10/10/2016 02:55 PM   HGB 13.6 03/11/2017 03:46 AM     Lipids: Lab Results  Component Value Date/Time   LDLCALC 117 (H) 02/10/2017 02:25 PM   CHOL 207 (H) 02/10/2017 02:25 PM   TRIG 238 (H) 02/10/2017 02:25 PM   HDL 54 02/10/2017 02:25 PM       ASSESSMENT AND PLAN: 1. CAD with history of 3-vessel CABG in 05/2012:Symptomatically stable. Left ventricular systolic function is normal, LVEF 65%.Continue aspirin and Toprol-XL 100 mg daily.  He developed severe diffuse pains with both Lipitor and Crestor.  He has been unable to afford Repatha.  I will try Livalo 2 mg daily.  2. Mixed dyslipidemia: Lipid panel 02/10/17 showed total cholesterol  207, HDL 54, triglycerides 238, LDL 117.  He developed severe diffuse pains with both Lipitor and Crestor.  He has been unable to afford Repatha.  I will try Livalo 2 mg daily.  I will check lipids after he has been consistently taking it for 2 months.  3. Hypothyroidism: Remains on Synthroid.  4.  Hypertension: Controlled on present therapy  which includes metoprolol succinate and amlodipine.  No changes.     Disposition: Follow up 6 months   Kate Sable, M.D., F.A.C.C.

## 2017-05-06 NOTE — Patient Instructions (Signed)
Medication Instructions:  START LIVALO 2 MG DAILY   Labwork: LIPIDS ( AFTER YOU HAVE STEADILY BEEN TAKING THE LIVALO FOR 2 MONTHS )   Testing/Procedures: NONE  Follow-Up: Your physician wants you to follow-up in: 6 MONTHS .  You will receive a reminder letter in the mail two months in advance. If you don't receive a letter, please call our office to schedule the follow-up appointment.   Any Other Special Instructions Will Be Listed Below (If Applicable).     If you need a refill on your cardiac medications before your next appointment, please call your pharmacy.

## 2017-05-21 ENCOUNTER — Other Ambulatory Visit: Payer: Self-pay | Admitting: *Deleted

## 2017-05-21 NOTE — Telephone Encounter (Signed)
Received call from patient.   Requested refill on Oxycodone/ APAP.   Ok to refill??  Last office visit 02/10/2017.  Last refill 04/23/2017.

## 2017-05-22 MED ORDER — OXYCODONE-ACETAMINOPHEN 5-325 MG PO TABS: 1.0000 | ORAL_TABLET | Freq: Three times a day (TID) | ORAL | 0 refills | Status: DC | PRN

## 2017-06-02 ENCOUNTER — Other Ambulatory Visit: Payer: Self-pay | Admitting: Family Medicine

## 2017-06-02 ENCOUNTER — Other Ambulatory Visit: Payer: Self-pay | Admitting: *Deleted

## 2017-06-02 MED ORDER — DIAZEPAM 5 MG PO TABS: ORAL_TABLET | ORAL | 0 refills | Status: DC

## 2017-06-02 NOTE — Telephone Encounter (Signed)
Received call from patient.   Requested refill on Diazepam for upcoming travel.   Ok to refill??  Last office visit 02/10/2017.  Last refill 06/09/2016.

## 2017-06-05 ENCOUNTER — Other Ambulatory Visit: Payer: Self-pay | Admitting: *Deleted

## 2017-06-05 MED ORDER — EVOLOCUMAB 140 MG/ML ~~LOC~~ SOSY: 140.0000 mg | PREFILLED_SYRINGE | SUBCUTANEOUS | 11 refills | Status: DC

## 2017-06-08 ENCOUNTER — Other Ambulatory Visit: Payer: Self-pay | Admitting: *Deleted

## 2017-06-08 MED ORDER — EVOLOCUMAB 140 MG/ML ~~LOC~~ SOSY: 140.0000 mg | PREFILLED_SYRINGE | SUBCUTANEOUS | 3 refills | Status: DC

## 2017-06-19 ENCOUNTER — Other Ambulatory Visit: Payer: Self-pay | Admitting: *Deleted

## 2017-06-19 MED ORDER — OXYCODONE-ACETAMINOPHEN 5-325 MG PO TABS: 1.0000 | ORAL_TABLET | Freq: Three times a day (TID) | ORAL | 0 refills | Status: DC | PRN

## 2017-06-19 NOTE — Telephone Encounter (Signed)
Received call from patient.   Requested refill on Oxycodone/APAP.   Ok to refill??  Last office visit 02/10/2017.  Last refill 05/22/2017.

## 2017-07-06 ENCOUNTER — Other Ambulatory Visit: Payer: Self-pay | Admitting: Cardiovascular Disease

## 2017-07-06 ENCOUNTER — Encounter: Payer: Self-pay | Admitting: Family Medicine

## 2017-07-06 ENCOUNTER — Telehealth: Payer: Self-pay | Admitting: Cardiovascular Disease

## 2017-07-06 ENCOUNTER — Ambulatory Visit (INDEPENDENT_AMBULATORY_CARE_PROVIDER_SITE_OTHER): Payer: Medicare HMO | Admitting: Family Medicine

## 2017-07-06 ENCOUNTER — Other Ambulatory Visit: Payer: Self-pay

## 2017-07-06 VITALS — BP 126/82 | HR 88 | Temp 98.1°F | Resp 14 | Ht 67.0 in | Wt 182.0 lb

## 2017-07-06 DIAGNOSIS — I10 Essential (primary) hypertension: Secondary | ICD-10-CM | POA: Diagnosis not present

## 2017-07-06 DIAGNOSIS — R0781 Pleurodynia: Secondary | ICD-10-CM | POA: Diagnosis not present

## 2017-07-06 DIAGNOSIS — F5104 Psychophysiologic insomnia: Secondary | ICD-10-CM

## 2017-07-06 DIAGNOSIS — F418 Other specified anxiety disorders: Secondary | ICD-10-CM

## 2017-07-06 DIAGNOSIS — G894 Chronic pain syndrome: Secondary | ICD-10-CM

## 2017-07-06 MED ORDER — DIAZEPAM 5 MG PO TABS: ORAL_TABLET | ORAL | 2 refills | Status: DC

## 2017-07-06 MED ORDER — OXYCODONE-ACETAMINOPHEN 5-325 MG PO TABS: 1.0000 | ORAL_TABLET | Freq: Three times a day (TID) | ORAL | 0 refills | Status: DC | PRN

## 2017-07-06 NOTE — Telephone Encounter (Signed)
Patient states that he was told by pharmacy [facility]) that they were waiting on prior authorization for Livalo. This medication was sent in on 05/06/17. / tg

## 2017-07-06 NOTE — Progress Notes (Signed)
Subjective:    Patient ID: Stephen Booker, male    DOB: Apr 27, 1950, 67 y.o.   MRN: [id number]  Patient presents for Rib Pain (states that he had multiple sneezes and now has crmap/ pain in back of L ribs like he had last year); Medication Management (discuss pain medications since he's going out of town frequently); and Anxiety/ Depression (states that when he had valium for traveling he felt much better and more actie- thinks he needs something daily to help him)  Pt here to f/u chronic medical problems   He will be traveling back to [place] to be with family. He gets anxious driving , has history of depression/anxiety given Valium initially to help with his first trip with driving and airplane he notes that he was much more calmer every day not feel very anxious.  This trip he has been missing multiple family members will be driving across state multiple times.  He is already anxious anticipating going on the trip.  Would like to try the Valium again daily  Chronic pain medication- takes percocet , will be gone for 1 month from July to August, will need pain medication   Rib pain on left side, at night when he turns on that side will have some discomfort and stabbing pain, started on last trip No pain with cough, but had a few sneezes in a row when pain initially started It has been improving on its own      Review Of Systems:  GEN- denies fatigue, fever, weight loss,weakness, recent illness HEENT- denies eye drainage, change in vision, nasal discharge, CVS- denies chest pain, palpitations RESP- denies SOB, cough, wheeze ABD- denies N/V, change in stools, abd pain GU- denies dysuria, hematuria, dribbling, incontinence MSK-+joint pain, muscle aches, injury Neuro- denies headache, dizziness, syncope, seizure activity       Objective:    BP 126/82   Pulse 88   Temp 98.1 F (36.7 C) (Oral)   Resp 14   Ht 5\' 7"  (1.702 m)   Wt 182 lb (82.6 kg)   SpO2 98%   BMI 28.51 kg/m   GEN- NAD, alert and oriented x3 HEENT- PERRL, EOMI, non injected sclera, pink conjunctiva, MMM, oropharynx clear Neck- Supple, no thyromegaly CVS- RRR, no murmur RESP-CTAB Chest wall- left ribs, no step off, no bruising, mild TTP lower rib ABD-NABS,soft,NT,ND Psych- normal affect and mood  EXT- No edema Pulses- Radial 2+        Assessment & Plan:      Problem List Items Addressed This Visit      Unprioritized   Anxiety with depression    I will let him try Valium 5 mg once a day advised he could take this in the evening he is not to take this with his narcotic medication.   Does have chronic back joint pain as well as insomnia so this may help multiple aspects of his life.      Relevant Medications   diazepam (VALIUM) 5 MG tablet   Chronic insomnia - Primary   Chronic pain syndrome    Continue pain medication.  I have given him a prescription to take in hand to the [facility] in [place] for August fill.      Hypertension    Pressure is controlled no change in medication.       Other Visit Diagnoses    Rib pain on left side       Mild tenderness, no illness, pain already improving, discussed  we will see if it takes it course, can image if pain worsens, he will let me know      Note: This dictation was prepared with Dragon dictation along with smaller phrase technology. Any transcriptional errors that result from this process are unintentional.

## 2017-07-06 NOTE — Patient Instructions (Addendum)
F/U December

## 2017-07-07 ENCOUNTER — Encounter: Payer: Self-pay | Admitting: Family Medicine

## 2017-07-07 NOTE — Telephone Encounter (Signed)
Prior Authorization completed with Cover my meds.

## 2017-07-07 NOTE — Assessment & Plan Note (Signed)
Pressure is controlled no change in medication. 

## 2017-07-07 NOTE — Assessment & Plan Note (Addendum)
I will let him try Valium 5 mg once a day advised he could take this in the evening he is not to take this with his narcotic medication.   Does have chronic back joint pain as well as insomnia so this may help multiple aspects of his life.

## 2017-07-07 NOTE — Assessment & Plan Note (Signed)
Continue pain medication.  I have given him a prescription to take in hand to the [facility] in [place] for August fill.

## 2017-07-16 ENCOUNTER — Other Ambulatory Visit: Payer: Self-pay

## 2017-07-20 ENCOUNTER — Other Ambulatory Visit: Payer: Self-pay | Admitting: *Deleted

## 2017-07-20 ENCOUNTER — Other Ambulatory Visit: Payer: Self-pay | Admitting: Family Medicine

## 2017-07-20 MED ORDER — OXYCODONE-ACETAMINOPHEN 5-325 MG PO TABS: 1.0000 | ORAL_TABLET | Freq: Three times a day (TID) | ORAL | 0 refills | Status: DC | PRN

## 2017-07-20 NOTE — Addendum Note (Signed)
Addended by: Sheral Flow on: 07/20/2017 01:57 PM   Modules accepted: Orders

## 2017-07-20 NOTE — Telephone Encounter (Signed)
Received return call from patient.   Reports that pharmacy does not have prescription for July pain medication.   Requested refill to be sent.

## 2017-07-20 NOTE — Telephone Encounter (Signed)
Received call from patient.   Requested refill on pain medication. Advised medication has been sent to pharmacy.   Verbalized understanding.

## 2017-07-21 ENCOUNTER — Other Ambulatory Visit: Payer: Self-pay

## 2017-07-27 ENCOUNTER — Other Ambulatory Visit: Payer: Self-pay

## 2017-07-27 MED ORDER — EVOLOCUMAB 140 MG/ML ~~LOC~~ SOAJ: 140.0000 mg | SUBCUTANEOUS | 4 refills | Status: DC

## 2017-07-27 NOTE — Telephone Encounter (Signed)
Per fax request, Repatha refill to [facility]

## 2017-08-10 ENCOUNTER — Other Ambulatory Visit: Payer: Self-pay | Admitting: Cardiovascular Disease

## 2017-08-10 NOTE — Telephone Encounter (Signed)
I spoke with Cristie Hem at [facility], called in Lopid as requested, they will call pt when rx is ready

## 2017-08-10 NOTE — Telephone Encounter (Signed)
°*  STAT* If patient is at the pharmacy, call can be transferred to refill team.   1. Which medications need to be refilled? (please list name of each medication and dose if known)  gemfibrozil (LOPID) 600 MG tablet [[id number]]    2. Which pharmacy/location (including street and city if local pharmacy) is medication to be sent to?  [place] [address]  Tele # [phone]   Spoke w/ Josie   3. Do they need a 30 day or 90 day supply?  90 day

## 2017-09-08 ENCOUNTER — Other Ambulatory Visit: Payer: Self-pay | Admitting: *Deleted

## 2017-09-08 MED ORDER — OXYCODONE-ACETAMINOPHEN 5-325 MG PO TABS: 1.0000 | ORAL_TABLET | Freq: Three times a day (TID) | ORAL | 0 refills | Status: DC | PRN

## 2017-09-08 NOTE — Telephone Encounter (Signed)
Received call from patient.   Requested refill on Oxycodone/APAP.   Ok to refill??  Last office visit 07/06/2017.  Last refill 07/20/2017.

## 2017-10-01 ENCOUNTER — Other Ambulatory Visit: Payer: Self-pay | Admitting: Cardiovascular Disease

## 2017-10-08 ENCOUNTER — Telehealth: Payer: Self-pay | Admitting: Cardiovascular Disease

## 2017-10-08 NOTE — Telephone Encounter (Signed)
According to phone note by K Pinnix LPN, pt was approved.I will need to speak to her upon her return.I placed second PA for livalo 2 mg today, await to hear from [facility]

## 2017-10-08 NOTE — Telephone Encounter (Signed)
Per pt-- he can't get the new medication Dr. Bronson Ing put him on for his cholesterol because it's needing a prior auth

## 2017-10-08 NOTE — Telephone Encounter (Signed)
PA for Livalo DENIED, NOT on Formulary with patients insurance     Please advise

## 2017-10-09 MED ORDER — PRAVASTATIN SODIUM 20 MG PO TABS: ORAL_TABLET | ORAL | 3 refills | Status: DC

## 2017-10-09 NOTE — Telephone Encounter (Signed)
Pt will try pravastatin 20 mg Monday, Wednesday, Friday   I will call Raquel Pharm-D at [facility] to see if pt qualifies for new study

## 2017-10-09 NOTE — Telephone Encounter (Signed)
[id number] 

## 2017-10-09 NOTE — Telephone Encounter (Signed)
Let's try pravastatin 20 mg every Monday, Wednesday, and Friday.  Repeat lipids in 3 months.

## 2017-10-12 ENCOUNTER — Other Ambulatory Visit: Payer: Self-pay | Admitting: *Deleted

## 2017-10-12 MED ORDER — OXYCODONE-ACETAMINOPHEN 5-325 MG PO TABS: 1.0000 | ORAL_TABLET | Freq: Three times a day (TID) | ORAL | 0 refills | Status: DC | PRN

## 2017-10-12 NOTE — Telephone Encounter (Signed)
Received call from patient.   Requested refill on Oxycodone/APAP.   Ok to refill??  Last office visit 07/06/2017.  Last refill 09/08/2017.

## 2017-10-13 ENCOUNTER — Encounter: Payer: Self-pay | Admitting: *Deleted

## 2017-11-03 ENCOUNTER — Telehealth: Payer: Self-pay | Admitting: Cardiovascular Disease

## 2017-11-03 NOTE — Telephone Encounter (Signed)
Called pt. Per Ladona Horns, there are staff messages from pharmacists that state he was approved for repatha. He states that he will call them

## 2017-11-03 NOTE — Telephone Encounter (Signed)
Pt called stating he cannot take the pravastatin (PRAVACHOL) 20 MG tablet [[id number]], it is giving him the same kind of symptoms

## 2017-11-09 ENCOUNTER — Other Ambulatory Visit: Payer: Self-pay | Admitting: *Deleted

## 2017-11-09 NOTE — Telephone Encounter (Signed)
Received call from patient.   Requested refill on Levothyroxine and Oxycodone/ APAP.   Ok to refill on Oxycodone/ APAP??  Last office visit 07/06/2017.  Last refill 10/12/2017.

## 2017-11-10 MED ORDER — OXYCODONE-ACETAMINOPHEN 5-325 MG PO TABS: 1.0000 | ORAL_TABLET | Freq: Three times a day (TID) | ORAL | 0 refills | Status: DC | PRN

## 2017-11-10 MED ORDER — LEVOTHYROXINE SODIUM 50 MCG PO TABS: 50.0000 ug | ORAL_TABLET | Freq: Every day | ORAL | 6 refills | Status: DC

## 2017-11-12 DIAGNOSIS — L821 Other seborrheic keratosis: Secondary | ICD-10-CM | POA: Diagnosis not present

## 2017-11-12 DIAGNOSIS — L570 Actinic keratosis: Secondary | ICD-10-CM | POA: Diagnosis not present

## 2017-11-12 DIAGNOSIS — D485 Neoplasm of uncertain behavior of skin: Secondary | ICD-10-CM | POA: Diagnosis not present

## 2017-11-12 DIAGNOSIS — C44311 Basal cell carcinoma of skin of nose: Secondary | ICD-10-CM | POA: Diagnosis not present

## 2017-11-12 DIAGNOSIS — L820 Inflamed seborrheic keratosis: Secondary | ICD-10-CM | POA: Diagnosis not present

## 2017-11-12 DIAGNOSIS — L812 Freckles: Secondary | ICD-10-CM | POA: Diagnosis not present

## 2017-11-12 DIAGNOSIS — D225 Melanocytic nevi of trunk: Secondary | ICD-10-CM | POA: Diagnosis not present

## 2017-11-12 DIAGNOSIS — C44319 Basal cell carcinoma of skin of other parts of face: Secondary | ICD-10-CM | POA: Diagnosis not present

## 2017-11-13 ENCOUNTER — Ambulatory Visit: Payer: Medicare HMO | Admitting: Urology

## 2017-11-13 DIAGNOSIS — R351 Nocturia: Secondary | ICD-10-CM | POA: Diagnosis not present

## 2017-11-13 DIAGNOSIS — N401 Enlarged prostate with lower urinary tract symptoms: Secondary | ICD-10-CM | POA: Diagnosis not present

## 2017-11-13 DIAGNOSIS — N5201 Erectile dysfunction due to arterial insufficiency: Secondary | ICD-10-CM | POA: Diagnosis not present

## 2017-11-20 ENCOUNTER — Encounter: Payer: Self-pay | Admitting: Cardiovascular Disease

## 2017-11-20 ENCOUNTER — Ambulatory Visit: Payer: Medicare HMO | Admitting: Cardiovascular Disease

## 2017-11-20 VITALS — BP 132/74 | HR 56 | Ht 67.0 in | Wt 176.0 lb

## 2017-11-20 DIAGNOSIS — R5383 Other fatigue: Secondary | ICD-10-CM | POA: Diagnosis not present

## 2017-11-20 DIAGNOSIS — E782 Mixed hyperlipidemia: Secondary | ICD-10-CM

## 2017-11-20 DIAGNOSIS — Z79899 Other long term (current) drug therapy: Secondary | ICD-10-CM | POA: Diagnosis not present

## 2017-11-20 DIAGNOSIS — Z789 Other specified health status: Secondary | ICD-10-CM | POA: Diagnosis not present

## 2017-11-20 DIAGNOSIS — E039 Hypothyroidism, unspecified: Secondary | ICD-10-CM | POA: Diagnosis not present

## 2017-11-20 DIAGNOSIS — I10 Essential (primary) hypertension: Secondary | ICD-10-CM | POA: Diagnosis not present

## 2017-11-20 DIAGNOSIS — I25708 Atherosclerosis of coronary artery bypass graft(s), unspecified, with other forms of angina pectoris: Secondary | ICD-10-CM | POA: Diagnosis not present

## 2017-11-20 MED ORDER — METOPROLOL SUCCINATE ER 50 MG PO TB24: 50.0000 mg | ORAL_TABLET | Freq: Every day | ORAL | 3 refills | Status: DC

## 2017-11-20 NOTE — Patient Instructions (Signed)
Medication Instructions:   DECREASE Toprol to 50 mg daily  If you need a refill on your cardiac medications before your next appointment, please call your pharmacy.   Lab work: none If you have labs (blood work) drawn today and your tests are completely normal, you will receive your results only by: Marland Kitchen MyChart Message (if you have MyChart) OR . A paper copy in the mail If you have any lab test that is abnormal or we need to change your treatment, we will call you to review the results.  Testing/Procedures: none  Follow-Up: At [facility], you and your health needs are our priority.  As part of our continuing mission to provide you with exceptional heart care, we have created designated Provider Care Teams.  These Care Teams include your primary Cardiologist (physician) and Advanced Practice Providers (APPs -  Physician Assistants and Nurse Practitioners) who all work together to provide you with the care you need, when you need it. You will need a follow up appointment in 6 months.  Please call our office 2 months in advance to schedule this appointment.   or one of the following Advanced Practice Providers on your designated Care Team:   [place], PA-C [facility]) . Ermalinda Barrios, PA-C ([facility])  Any Other Special Instructions Will Be Listed Below (If Applicable). Call Kristen or Raquel at [facility] [phone]
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
## 2017-11-24 ENCOUNTER — Ambulatory Visit: Payer: Medicare HMO | Admitting: Family Medicine

## 2017-11-30 ENCOUNTER — Ambulatory Visit: Payer: Medicare HMO

## 2017-12-08 ENCOUNTER — Other Ambulatory Visit: Payer: Self-pay | Admitting: *Deleted

## 2017-12-08 MED ORDER — OXYCODONE-ACETAMINOPHEN 5-325 MG PO TABS: 1.0000 | ORAL_TABLET | Freq: Three times a day (TID) | ORAL | 0 refills | Status: DC | PRN

## 2017-12-08 NOTE — Telephone Encounter (Signed)
Received call from patient.   Requested refill on Oxycodone/APAP.   Ok to refill??  Last office visit 07/06/2017.  Last refill 11/10/2017.

## 2017-12-15 ENCOUNTER — Ambulatory Visit (INDEPENDENT_AMBULATORY_CARE_PROVIDER_SITE_OTHER): Payer: Medicare HMO | Admitting: Family Medicine

## 2017-12-15 ENCOUNTER — Encounter: Payer: Self-pay | Admitting: Family Medicine

## 2017-12-15 ENCOUNTER — Other Ambulatory Visit: Payer: Self-pay

## 2017-12-15 VITALS — BP 134/78 | HR 70 | Temp 98.7°F | Resp 14 | Ht 67.0 in | Wt 173.0 lb

## 2017-12-15 DIAGNOSIS — E039 Hypothyroidism, unspecified: Secondary | ICD-10-CM | POA: Diagnosis not present

## 2017-12-15 DIAGNOSIS — Z23 Encounter for immunization: Secondary | ICD-10-CM

## 2017-12-15 DIAGNOSIS — E782 Mixed hyperlipidemia: Secondary | ICD-10-CM

## 2017-12-15 DIAGNOSIS — M1711 Unilateral primary osteoarthritis, right knee: Secondary | ICD-10-CM | POA: Diagnosis not present

## 2017-12-15 DIAGNOSIS — I25708 Atherosclerosis of coronary artery bypass graft(s), unspecified, with other forms of angina pectoris: Secondary | ICD-10-CM | POA: Diagnosis not present

## 2017-12-15 MED ORDER — DICLOFENAC SODIUM 75 MG PO TBEC: 75.0000 mg | DELAYED_RELEASE_TABLET | Freq: Two times a day (BID) | ORAL | 2 refills | Status: DC

## 2017-12-15 NOTE — Assessment & Plan Note (Signed)
Trying to control his secondary risk factors No changes to meds

## 2017-12-15 NOTE — Progress Notes (Signed)
   Subjective:    Patient ID: Stephen Booker, male    DOB: Jan 12, 1950, 67 y.o.   MRN: [id number]  Patient presents for R Knee Pain (x1 month- progressivly worsening- wants referral)  Pt here with right knee pain, worse over the past month, after doing a lot of yard work, he has pain anterior and the lateral side. He has aching constantly that runs down to his calf and to his thigh at time. He has history of arthroscopy in his 20's and again in his 70's. He has been using crutches to get around some days  Some days after walking on it, pain does improve some  Denies any effusion, or buckling, but will get severe pain that shoots through Difficulty going up step and twisting  He has been taking Percocet, does not have the volatern    He has appt with lipid clinic planning to start the repatha   Dermatology- Dec 18th, has basal cell CA on his cheek [facility]    Due for FLu shot     Review Of Systems:  GEN- denies fatigue, fever, weight loss,weakness, recent illness HEENT- denies eye drainage, change in vision, nasal discharge, CVS- denies chest pain, palpitations RESP- denies SOB, cough, wheeze ABD- denies N/V, change in stools, abd pain GU- denies dysuria, hematuria, dribbling, incontinence MSK-+joint pain, muscle aches, injury Neuro- denies headache, dizziness, syncope, seizure activity       Objective:    BP 134/78   Pulse 70   Temp 98.7 F (37.1 C) (Oral)   Resp 14   Ht 5\' 7"  (1.702 m)   Wt 173 lb (78.5 kg)   SpO2 96%   BMI 27.10 kg/m  GEN- NAD, alert and oriented x3 CVS- RRR, no murmur RESP-CTAB MSK- Bilat knee, normal inspection, no erythema, fair ROM bilat, mild crepitus, ligaments grossly in tact, antalgic gait, walking with crutches EXT- No edema Pulses- Radial 2+        Assessment & Plan:      Problem List Items Addressed This Visit      Unprioritized   CAD (coronary artery disease)    Trying to control his secondary risk factors No  changes to meds      Relevant Orders   CBC with Differential/Platelet   Comprehensive metabolic panel   Hyperlipidemia    Recheck fasting lipids Cardiology trying to get set up for repatha      Relevant Orders   Lipid panel   Hypothyroidism   Relevant Orders   TSH   OA (osteoarthritis) of knee - Primary    Continue pain medication, oral diclofenac given  Referral to ortho as he has had intervention in the past on this knee      Relevant Medications   diclofenac (VOLTAREN) 75 MG EC tablet   Other Relevant Orders   Ambulatory referral to Orthopedic Surgery    Other Visit Diagnoses    Need for prophylactic vaccination and inoculation against influenza       Relevant Orders   Flu vaccine HIGH DOSE PF (Completed)      Note: This dictation was prepared with Dragon dictation along with smaller phrase technology. Any transcriptional errors that result from this process are unintentional.

## 2017-12-15 NOTE — Assessment & Plan Note (Signed)
Continue pain medication, oral diclofenac given  Referral to ortho as he has had intervention in the past on this knee

## 2017-12-15 NOTE — Patient Instructions (Addendum)
Return Friday for fasting labs  Diclofenac 75mg  BID with food  Referral to orthopedics  F/U 4 months for Physical

## 2017-12-15 NOTE — Assessment & Plan Note (Signed)
Recheck fasting lipids Cardiology trying to get set up for repatha

## 2017-12-17 ENCOUNTER — Ambulatory Visit (INDEPENDENT_AMBULATORY_CARE_PROVIDER_SITE_OTHER): Payer: Medicare HMO | Admitting: Pharmacist

## 2017-12-17 DIAGNOSIS — E782 Mixed hyperlipidemia: Secondary | ICD-10-CM | POA: Diagnosis not present

## 2017-12-17 NOTE — Patient Instructions (Addendum)
Lipid Clinic (pharmacist) Nikki Rusnak/Kristin [phone]  *START paperwork for Repatha 140mg  [place] every 14 days* *REPEAT fasting blood work after 4 doses of Repatha *   Cholesterol Cholesterol is a fat. Your body needs a small amount of cholesterol. Cholesterol (plaque) may build up in your blood vessels (arteries). That makes you more likely to have a heart attack or stroke. You cannot feel your cholesterol level. Having a blood test is the only way to find out if your level is high. Keep your test results. Work with your doctor to keep your cholesterol at a good level. What do the results mean?  Total cholesterol is how much cholesterol is in your blood.  LDL is bad cholesterol. This is the type that can build up. Try to have low LDL.  HDL is good cholesterol. It cleans your blood vessels and carries LDL away. Try to have high HDL.  Triglycerides are fat that the body can store or burn for energy. What are good levels of cholesterol?  Total cholesterol below 200.  LDL below 100 is good for people who have health risks. LDL below 70 is good for people who have very high risks.  HDL above 40 is good. It is best to have HDL of 60 or higher.  Triglycerides below 150. How can I lower my cholesterol? Diet Follow your diet program as told by your doctor.  Choose fish, white meat chicken, or [place] that is roasted or baked. Try not to eat red meat, fried foods, sausage, or lunch meats.  Eat lots of fresh fruits and vegetables.  Choose whole grains, beans, pasta, potatoes, and cereals.  Choose olive oil, corn oil, or canola oil. Only use small amounts.  Try not to eat butter, mayonnaise, shortening, or palm kernel oils.  Try not to eat foods with trans fats.  Choose low-fat or nonfat dairy foods. ? Drink skim or nonfat milk. ? Eat low-fat or nonfat yogurt and cheeses. ? Try not to drink whole milk or cream. ? Try not to eat ice cream, egg yolks, or full-fat cheeses.  Healthy  desserts include angel food cake, ginger snaps, animal crackers, hard candy, popsicles, and low-fat or nonfat frozen yogurt. Try not to eat pastries, cakes, pies, and cookies.  Exercise Follow your exercise program as told by your doctor.  Be more active. Try gardening, walking, and taking the stairs.  Ask your doctor about ways that you can be more active.  Medicine  Take over-the-counter and prescription medicines only as told by your doctor. This information is not intended to replace advice given to you by your health care provider. Make sure you discuss any questions you have with your health care provider. Document Released: 03/21/2008 Document Revised: 07/25/2015 Document Reviewed: 07/05/2015 Elsevier Interactive Patient Education  [employer].

## 2017-12-17 NOTE — Progress Notes (Signed)
Patient ID: Nial Hawe                 DOB: [birth year]                    MRN: [id number]     HPI: Marquette Blodgett is a 67 y.o. male patient referred to lipid clinic by Dr Bronson Ing. PMH is significant for hypertension, hyperlipidemia, CAD s/p CABG x 3 in 2014, thyroid disease, and statin intolerance.  Patient presents today for potential PCSK9i initiation. Repatha PA was approved bu his insurance in the past ,but patient was unable to afford medication and did NOT follow up with pharmacist as requested for help with patient assistance paperwork. Preliminary assessment was completed for [place] clinical study, but after completing the evaluation the patient changed his mind and decided not to enroll in the study.  He presents to clinic today and seen very confuse about the process to obtain his medication. Reports compliance with all current prescribed medication ,but is unable to tolerate statins due to severe joint and muscle pain.   Current Medications:  Gemfibrozil 600mg  twice daily  Intolerances:  rosuvastatin 5mg  daily Pravastatin 20mg  daily pitavastatin 2mg  daily  LDL goal: < 70mg /dL  Diet: no sodas, cooks most of his meals, half & half in coffee   Exercise: Activities of daily living  Family History: CABG x 4 in mother,   Social History: former srmer smoker, no alcohol > 5 years,   Labs: 12/17/17: CHO 257; TG 178; HDL 53; LDL-c 168 (gemfibrozil 600mg  BID)   Past Medical History:  Diagnosis Date  . Arthritis   . Back pain   . Coronary artery disease   . Diverticulitis   . Hyperlipidemia   . Hypertension   . S/P triple vessel bypass   . Thyroid disease     Current Outpatient Medications on File Prior to Visit  Medication Sig Dispense Refill  . amLODipine (NORVASC) 5 MG tablet TAKE 1 TABLET BY MOUTH ONCE DAILY 90 tablet 2  . aspirin EC 81 MG tablet Take 81 mg by mouth daily.    . diazepam (VALIUM) 5 MG tablet Take 1 hour before flight/rides 30 tablet 2  . diclofenac  (VOLTAREN) 75 MG EC tablet Take 1 tablet (75 mg total) by mouth 2 (two) times daily. 60 tablet 2  . gemfibrozil (LOPID) 600 MG tablet Take 1 tablet (600 mg total) by mouth 2 (two) times daily before a meal. 180 tablet 3  . levothyroxine (SYNTHROID, LEVOTHROID) 50 MCG tablet Take 1 tablet (50 mcg total) by mouth daily before breakfast. 30 tablet 6  . metoprolol succinate (TOPROL-XL) 50 MG 24 hr tablet Take 1 tablet (50 mg total) by mouth daily. Take with or immediately following a meal. 90 tablet 3  . nitroGLYCERIN (NITROSTAT) 0.4 MG SL tablet Place 1 tablet (0.4 mg total) under the tongue every 5 (five) minutes as needed for chest pain. 25 tablet 3  . oxyCODONE-acetaminophen (PERCOCET/ROXICET) 5-325 MG tablet Take 1 tablet by mouth every 8 (eight) hours as needed. 90 tablet 0  . Papav-Phentolamine-Alprostadil 12-1-0.01 MG/ML SOLN by Intracavernosal route.     No current facility-administered medications on file prior to visit.     Allergies  Allergen Reactions  . Statins     Myalgia   . Penicillins Rash    Hyperlipidemia LDL remains above desired goal of < 70mg /dL for secondary prevention. Noted patient unable to tolerate statins with known failure to rosuvastatin low dose, pravastatin, and pitavastatin.  Patient is a good candidate for PCSK9i therapy and reports no problems with injectable medication.  Repatha/Praleunt indication, MOA, storage, admoinistration, common side effects, PA process, and patient assistance program were discussed during this office visit. Patient was able to self-administered 1st dose of Repatha 140mg  SureClick (LOT [id number]; Exp 04/22) with pharmacist guidance and if confident he can repeat the process at home.  Will re-apply for Repatha prior authorization, and submit form for [facility] assistance if patient is unable to afford co-pay. Plan to follow up as needed with patient and repeat fasting Lipid and LFTs around the 5th Repatha injection.      Lakie Mclouth  Rodriguez-Guzman PharmD, BCPS, [place] [id number] 12/18/2017 2:16 PM

## 2017-12-18 ENCOUNTER — Encounter: Payer: Self-pay | Admitting: Pharmacist

## 2017-12-18 ENCOUNTER — Other Ambulatory Visit: Payer: Medicare HMO

## 2017-12-18 ENCOUNTER — Telehealth: Payer: Self-pay

## 2017-12-18 DIAGNOSIS — E782 Mixed hyperlipidemia: Secondary | ICD-10-CM

## 2017-12-18 DIAGNOSIS — E039 Hypothyroidism, unspecified: Secondary | ICD-10-CM | POA: Diagnosis not present

## 2017-12-18 DIAGNOSIS — I25708 Atherosclerosis of coronary artery bypass graft(s), unspecified, with other forms of angina pectoris: Secondary | ICD-10-CM | POA: Diagnosis not present

## 2017-12-18 LAB — HEPATIC FUNCTION PANEL
ALBUMIN: 5 g/dL — AB (ref 3.6–4.8)
ALT: 16 IU/L (ref 0–44)
AST: 19 IU/L (ref 0–40)
Alkaline Phosphatase: 68 IU/L (ref 39–117)
Bilirubin Total: 0.4 mg/dL (ref 0.0–1.2)
Bilirubin, Direct: 0.11 mg/dL (ref 0.00–0.40)
Total Protein: 7.4 g/dL (ref 6.0–8.5)

## 2017-12-18 LAB — LIPID PANEL
Chol/HDL Ratio: 4.8 ratio (ref 0.0–5.0)
Cholesterol, Total: 257 mg/dL — ABNORMAL HIGH (ref 100–199)
HDL: 53 mg/dL (ref >39)
LDL Calculated: 168 mg/dL — ABNORMAL HIGH (ref 0–99)
Triglycerides: 178 mg/dL — ABNORMAL HIGH (ref 0–149)
VLDL Cholesterol Cal: 36 mg/dL (ref 5–40)

## 2017-12-18 NOTE — Telephone Encounter (Signed)
-----   Message from Herminio Commons, MD sent at 12/18/2017 10:05 AM EST ----- LDL markedly elevated. Needs Repatha.

## 2017-12-18 NOTE — Assessment & Plan Note (Signed)
LDL remains above desired goal of < 70mg /dL for secondary prevention. Noted patient unable to tolerate statins with known failure to rosuvastatin low dose, pravastatin, and pitavastatin. Patient is a good candidate for PCSK9i therapy and reports no problems with injectable medication.  Repatha/Praleunt indication, MOA, storage, admoinistration, common side effects, PA process, and patient assistance program were discussed during this office visit. Patient was able to self-administered 1st dose of Repatha 140mg  SureClick (LOT [id number]; Exp 04/22) with pharmacist guidance and if confident he can repeat the process at home.  Will re-apply for Repatha prior authorization, and submit form for [facility] assistance if patient is unable to afford co-pay. Plan to follow up as needed with patient and repeat fasting Lipid and LFTs around the 5th Repatha injection.

## 2017-12-18 NOTE — Telephone Encounter (Signed)
Blood work ordered to [employer] representative for [employer]. Thanks for update.

## 2017-12-18 NOTE — Telephone Encounter (Signed)
I will forward to Raquel so that she will know what his current labs are.

## 2017-12-19 LAB — CBC WITH DIFFERENTIAL/PLATELET
BASOS PCT: 0.8 %
Basophils Absolute: 32 cells/uL (ref 0–200)
EOS ABS: 112 {cells}/uL (ref 15–500)
Eosinophils Relative: 2.8 %
HCT: 41.5 % (ref 38.5–50.0)
Hemoglobin: 14.7 g/dL (ref 13.2–17.1)
Lymphs Abs: 1248 cells/uL (ref 850–3900)
MCH: 28.9 pg (ref 27.0–33.0)
MCHC: 35.4 g/dL (ref 32.0–36.0)
MCV: 81.7 fL (ref 80.0–100.0)
MPV: 9.4 fL (ref 7.5–12.5)
Monocytes Relative: 15.9 %
Neutro Abs: 1972 cells/uL (ref 1500–7800)
Neutrophils Relative %: 49.3 %
Platelets: 360 10*3/uL (ref 140–400)
RBC: 5.08 10*6/uL (ref 4.20–5.80)
RDW: 12.4 % (ref 11.0–15.0)
Total Lymphocyte: 31.2 %
WBC mixed population: 636 cells/uL (ref 200–950)
WBC: 4 10*3/uL (ref 3.8–10.8)

## 2017-12-19 LAB — COMPREHENSIVE METABOLIC PANEL
AG Ratio: 1.7 (calc) (ref 1.0–2.5)
ALT: 14 U/L (ref 9–46)
AST: 18 U/L (ref 10–35)
Albumin: 4.6 g/dL (ref 3.6–5.1)
Alkaline phosphatase (APISO): 60 U/L (ref 40–115)
BUN: 20 mg/dL (ref 7–25)
CO2: 25 mmol/L (ref 20–32)
Calcium: 10 mg/dL (ref 8.6–10.3)
Chloride: 104 mmol/L (ref 98–110)
Creat: 0.99 mg/dL (ref 0.70–1.25)
Globulin: 2.7 g/dL (calc) (ref 1.9–3.7)
Glucose, Bld: 98 mg/dL (ref 65–99)
Potassium: 4.4 mmol/L (ref 3.5–5.3)
Sodium: 142 mmol/L (ref 135–146)
Total Bilirubin: 0.5 mg/dL (ref 0.2–1.2)
Total Protein: 7.3 g/dL (ref 6.1–8.1)

## 2017-12-19 LAB — TSH: TSH: 0.56 mIU/L (ref 0.40–4.50)

## 2017-12-21 ENCOUNTER — Ambulatory Visit (INDEPENDENT_AMBULATORY_CARE_PROVIDER_SITE_OTHER): Payer: Medicare HMO

## 2017-12-21 ENCOUNTER — Encounter: Payer: Self-pay | Admitting: *Deleted

## 2017-12-21 ENCOUNTER — Encounter: Payer: Self-pay | Admitting: Orthopedic Surgery

## 2017-12-21 ENCOUNTER — Ambulatory Visit: Payer: Medicare HMO | Admitting: Orthopedic Surgery

## 2017-12-21 ENCOUNTER — Other Ambulatory Visit: Payer: Self-pay | Admitting: Family Medicine

## 2017-12-21 ENCOUNTER — Other Ambulatory Visit: Payer: Self-pay | Admitting: Cardiovascular Disease

## 2017-12-21 VITALS — BP 142/82 | HR 66 | Ht 67.0 in | Wt 170.0 lb

## 2017-12-21 DIAGNOSIS — M5441 Lumbago with sciatica, right side: Secondary | ICD-10-CM | POA: Diagnosis not present

## 2017-12-21 DIAGNOSIS — M25561 Pain in right knee: Secondary | ICD-10-CM

## 2017-12-21 DIAGNOSIS — G8929 Other chronic pain: Secondary | ICD-10-CM
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
## 2017-12-21 NOTE — Patient Instructions (Signed)
#  1.  Please pick up the medication Dr Buelah Manis prescribed and start taking  #2.  Use the knee brace that you have  #3 start physical therapy for your back and lateral leg pain

## 2017-12-22 NOTE — Telephone Encounter (Signed)
Ok to refill??  Last office visit 12/15/2017.  Last refill 07/06/2017, #2 refills.   Of note, medication is only prescribed prior to flights.

## 2017-12-23 DIAGNOSIS — Z85828 Personal history of other malignant neoplasm of skin: Secondary | ICD-10-CM | POA: Diagnosis not present

## 2017-12-23 DIAGNOSIS — C44319 Basal cell carcinoma of skin of other parts of face: Secondary | ICD-10-CM | POA: Diagnosis not present

## 2017-12-24 ENCOUNTER — Other Ambulatory Visit: Payer: Self-pay | Admitting: *Deleted

## 2017-12-24 NOTE — Telephone Encounter (Signed)
Received call from patient.   States that he is going out of the state to visit his family for the holidays. Reports that he is going to [place], [place] and [place]. He will not be returning until after the new year.   Requested printed prescription for pain medication as it will be due while he is out of town.   Ok to refill??  Last office visit 12/15/2017.  Last refill 12/08/2017.

## 2017-12-25 MED ORDER — OXYCODONE-ACETAMINOPHEN 5-325 MG PO TABS: 1.0000 | ORAL_TABLET | Freq: Three times a day (TID) | ORAL | 0 refills | Status: DC | PRN

## 2017-12-25 NOTE — Telephone Encounter (Signed)
Script refilled

## 2018-01-08 ENCOUNTER — Ambulatory Visit (INDEPENDENT_AMBULATORY_CARE_PROVIDER_SITE_OTHER): Payer: Medicare HMO | Admitting: Family Medicine

## 2018-01-08 ENCOUNTER — Other Ambulatory Visit: Payer: Self-pay

## 2018-01-08 ENCOUNTER — Encounter: Payer: Self-pay | Admitting: Family Medicine

## 2018-01-08 VITALS — BP 130/62 | HR 66 | Temp 98.9°F | Resp 16 | Ht 67.0 in | Wt 176.0 lb

## 2018-01-08 DIAGNOSIS — K5792 Diverticulitis of intestine, part unspecified, without perforation or abscess without bleeding: Secondary | ICD-10-CM

## 2018-01-08 MED ORDER — METRONIDAZOLE 500 MG PO TABS: 500.0000 mg | ORAL_TABLET | Freq: Two times a day (BID) | ORAL | 0 refills | Status: DC

## 2018-01-08 MED ORDER — CIPROFLOXACIN HCL 500 MG PO TABS: 500.0000 mg | ORAL_TABLET | Freq: Two times a day (BID) | ORAL | 0 refills | Status: DC

## 2018-01-08 NOTE — Progress Notes (Signed)
   Subjective:    Patient ID: Stephen Booker, male    DOB: [birth year], 68 y.o.   MRN: [id number]  Patient presents for Illness (started on 12/20- diverticulitis pain- small bowel movements each day and tender to LLQ- took leftover doxy with no relief)   Pt here with abdominal pain that started on 12/20. He has history of diverticulitis, felt like previous flares with multiple very small formed bowel movements in the morning and pain in his LLQ region.  Denies blood in stool, no fever. He has had some some straining and feels relieved after bowel movement. No vomiting , no fever, mild nausea a few days.  During the day feels okay but then pain returns     Weight up 3lbs since his visit 1 month ago      Review Of Systems:  GEN- denies fatigue, fever, weight loss,weakness, recent illness HEENT- denies eye drainage, change in vision, nasal discharge, CVS- denies chest pain, palpitations RESP- denies SOB, cough, wheeze ABD- denies N/V, +change in stools, +abd pain GU- denies dysuria, hematuria, dribbling, incontinence MSK- denies joint pain, muscle aches, injury Neuro- denies headache, dizziness, syncope, seizure activity       Objective:    BP 130/62   Pulse 66   Temp 98.9 F (37.2 C) (Oral)   Resp 16   Ht 5\' 7"  (1.702 m)   Wt 176 lb (79.8 kg)   SpO2 95%   BMI 27.57 kg/m  GEN- NAD, alert and oriented x3 HEENT- PERRL, EOMI, non injected sclera, pink conjunctiva, MMM, oropharynx clear Neck- Supple, no LAD CVS- RRR, no murmur RESP-CTAB ABD-NABS,soft,ND, TTP LLQ, no rebound, no guarding, no CVA tenderness EXT- No edema Pulses- Radial  2+        Assessment & Plan:      Problem List Items Addressed This Visit    None    Visit Diagnoses    Acute diverticulitis    -  Primary   Based on history and current smoldering symptoms past 10 days, cipro and flagyl, fluids    Relevant Medications   ciprofloxacin (CIPRO) 500 MG tablet   metroNIDAZOLE (FLAGYL) 500 MG tablet      Note: This dictation was prepared with Dragon dictation along with smaller phrase technology. Any transcriptional errors that result from this process are unintentional.

## 2018-01-08 NOTE — Patient Instructions (Signed)
Take the antibiotics Plenty of water with your medication Take the thyroid medication first thing in the morning F/U as previous

## 2018-01-09 ENCOUNTER — Encounter: Payer: Self-pay | Admitting: Family Medicine

## 2018-01-12 ENCOUNTER — Telehealth: Payer: Self-pay

## 2018-01-12 NOTE — Telephone Encounter (Signed)
Called pt because we are expecting the copay to be lower instructed them to go to the pharmacy to see what the copay will be if still unaffordable call the number in the letter because we have done everything that we can do from our end

## 2018-01-21 ENCOUNTER — Other Ambulatory Visit: Payer: Self-pay | Admitting: Pharmacist

## 2018-01-21 MED ORDER — EVOLOCUMAB 140 MG/ML ~~LOC~~ SOAJ: 140.0000 mg | SUBCUTANEOUS | 11 refills | Status: DC

## 2018-01-22 ENCOUNTER — Telehealth: Payer: Self-pay | Admitting: *Deleted

## 2018-01-22 DIAGNOSIS — K5792 Diverticulitis of intestine, part unspecified, without perforation or abscess without bleeding: Secondary | ICD-10-CM

## 2018-01-22 DIAGNOSIS — R1084 Generalized abdominal pain: Secondary | ICD-10-CM

## 2018-01-22 NOTE — Telephone Encounter (Signed)
CT abd./pelvis to be set up, with contrast Sx- abd pain, diverticulitis

## 2018-01-22 NOTE — Telephone Encounter (Signed)
Received call from patient.   Reports that he has completed ABTx for diverticulitis, but he continues to have pain.   MD please advise.

## 2018-01-23 NOTE — Telephone Encounter (Signed)
Call placed to patient and patient made aware per VM.   CT orders placed.

## 2018-01-28 ENCOUNTER — Ambulatory Visit
Admission: RE | Admit: 2018-01-28 | Discharge: 2018-01-28 | Disposition: A | Discharge status: Home / Self Care | Payer: Medicare HMO | Source: Ambulatory Visit | Attending: Family Medicine | Admitting: Family Medicine

## 2018-01-28 DIAGNOSIS — K5792 Diverticulitis of intestine, part unspecified, without perforation or abscess without bleeding: Secondary | ICD-10-CM

## 2018-01-28 DIAGNOSIS — K760 Fatty (change of) liver, not elsewhere classified: Secondary | ICD-10-CM | POA: Diagnosis not present

## 2018-01-28 DIAGNOSIS — R1084 Generalized abdominal pain: Secondary | ICD-10-CM

## 2018-01-28 DIAGNOSIS — K5730 Diverticulosis of large intestine without perforation or abscess without bleeding: Secondary | ICD-10-CM | POA: Diagnosis not present

## 2018-01-28 MED ORDER — IOPAMIDOL (ISOVUE-300) INJECTION 61%
100.0000 mL | Freq: Once | INTRAVENOUS | Status: AC | PRN
  Administered 2018-01-28: 100 mL via INTRAVENOUS

## 2018-01-29 ENCOUNTER — Other Ambulatory Visit: Payer: Self-pay | Admitting: *Deleted

## 2018-01-29 MED ORDER — DICYCLOMINE HCL 10 MG PO CAPS: 10.0000 mg | ORAL_CAPSULE | Freq: Three times a day (TID) | ORAL | 3 refills | Status: DC | PRN

## 2018-02-01 ENCOUNTER — Ambulatory Visit: Payer: Medicare HMO | Admitting: Orthopedic Surgery

## 2018-02-02 ENCOUNTER — Ambulatory Visit: Payer: Self-pay | Admitting: Family Medicine

## 2018-02-03 ENCOUNTER — Encounter: Payer: Self-pay | Admitting: Family Medicine

## 2018-02-03 ENCOUNTER — Ambulatory Visit (INDEPENDENT_AMBULATORY_CARE_PROVIDER_SITE_OTHER): Payer: Medicare HMO | Admitting: Family Medicine

## 2018-02-03 ENCOUNTER — Other Ambulatory Visit: Payer: Self-pay

## 2018-02-03 VITALS — BP 122/68 | HR 80 | Temp 98.4°F | Resp 16 | Ht 67.0 in | Wt 173.0 lb

## 2018-02-03 DIAGNOSIS — K5790 Diverticulosis of intestine, part unspecified, without perforation or abscess without bleeding: Secondary | ICD-10-CM | POA: Diagnosis not present

## 2018-02-03 MED ORDER — OXYCODONE-ACETAMINOPHEN 5-325 MG PO TABS: 1.0000 | ORAL_TABLET | Freq: Three times a day (TID) | ORAL | 0 refills | Status: DC | PRN

## 2018-02-03 NOTE — Assessment & Plan Note (Signed)
Back to baseline Monitored diet Off NSAID Will see how he does off NSAID. Regular chronic pain meds refilled Advised constipation will occur the more narcotic he takes

## 2018-02-03 NOTE — Patient Instructions (Signed)
F/U 4 months  

## 2018-02-03 NOTE — Progress Notes (Signed)
   Subjective:    Patient ID: Stephen Booker, male    DOB: 18-Jan-1950, 68 y.o.   MRN: [id number]  Patient presents for Follow-up (abd pain)  Pt here to f/u abdominal pain. He completed the antibiotics for probable diverticulitis. He called back CT no acute findings. He stopped diclofenac Friday and pain improved over the weekend. Sunday became very constipated and Monday he strained to have a bowel movement which was very hard. Tuesday had BM with less cramps. Today had normal BM.  No further abdominal pain He has not needed any extra pain medication past few days  His appetite is back to normal   Review Of Systems:  GEN- denies fatigue, fever, weight loss,weakness, recent illness HEENT- denies eye drainage, change in vision, nasal discharge, CVS- denies chest pain, palpitations RESP- denies SOB, cough, wheeze ABD- denies N/V, change in stools, abd pain GU- denies dysuria, hematuria, dribbling, incontinence MSK- denies joint pain, muscle aches, injury Neuro- denies headache, dizziness, syncope, seizure activity       Objective:    BP 122/68   Pulse 80   Temp 98.4 F (36.9 C) (Oral)   Resp 16   Ht 5\' 7"  (1.702 m)   Wt 173 lb (78.5 kg)   SpO2 97%   BMI 27.10 kg/m  GEN- NAD, alert and oriented x3 HEENT- PERRL, EOMI, non injected sclera, pink conjunctiva, MMM, oropharynx clear CVS- RRR, no murmur RESP-CTAB ABD-NABS,soft,NT,ND EXT- No edema Pulses- Radial 2+        Assessment & Plan:      Problem List Items Addressed This Visit      Unprioritized   Diverticulosis - Primary    Back to baseline Monitored diet Off NSAID Will see how he does off NSAID. Regular chronic pain meds refilled Advised constipation will occur the more narcotic he takes          Note: This dictation was prepared with Dragon dictation along with smaller phrase technology. Any transcriptional errors that result from this process are unintentional.

## 2018-02-05 ENCOUNTER — Ambulatory Visit: Payer: Medicare HMO | Admitting: Urology

## 2018-02-05 DIAGNOSIS — R351 Nocturia: Secondary | ICD-10-CM | POA: Diagnosis not present

## 2018-02-05 DIAGNOSIS — N5201 Erectile dysfunction due to arterial insufficiency: Secondary | ICD-10-CM | POA: Diagnosis not present

## 2018-02-05 DIAGNOSIS — N401 Enlarged prostate with lower urinary tract symptoms: Secondary | ICD-10-CM | POA: Diagnosis not present

## 2018-02-11 ENCOUNTER — Encounter: Payer: Self-pay | Admitting: Family Medicine

## 2018-02-17 ENCOUNTER — Ambulatory Visit: Payer: Medicare HMO | Admitting: Orthopedic Surgery

## 2018-02-17 VITALS — BP 135/82 | HR 61 | Ht 67.0 in | Wt 173.0 lb

## 2018-02-17 DIAGNOSIS — M25561 Pain in right knee: Secondary | ICD-10-CM

## 2018-02-17 DIAGNOSIS — G8929 Other chronic pain: Secondary | ICD-10-CM

## 2018-02-17 DIAGNOSIS — M5441 Lumbago with sciatica, right side: Secondary | ICD-10-CM | POA: Diagnosis not present
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
## 2018-03-04 ENCOUNTER — Other Ambulatory Visit: Payer: Self-pay | Admitting: *Deleted

## 2018-03-04 NOTE — Telephone Encounter (Signed)
Received call from patient.   Requested refill on Oxycodone/APAP.   Ok to refill??  Last office visit/ refill 02/03/2018.

## 2018-03-05 MED ORDER — OXYCODONE-ACETAMINOPHEN 5-325 MG PO TABS: 1.0000 | ORAL_TABLET | Freq: Three times a day (TID) | ORAL | 0 refills | Status: DC | PRN

## 2018-04-01 ENCOUNTER — Other Ambulatory Visit: Payer: Self-pay | Admitting: *Deleted

## 2018-04-01 NOTE — Telephone Encounter (Signed)
Received call from patient.   Requested refill on Oxycodone/APAP.  Ok to refill??  Last office visit 02/03/2018.  Last refill 03/05/2018.

## 2018-04-02 MED ORDER — OXYCODONE-ACETAMINOPHEN 5-325 MG PO TABS: 1.0000 | ORAL_TABLET | Freq: Three times a day (TID) | ORAL | 0 refills | Status: DC | PRN

## 2018-04-15 ENCOUNTER — Other Ambulatory Visit: Payer: Self-pay | Admitting: Family Medicine

## 2018-04-15 ENCOUNTER — Other Ambulatory Visit: Payer: Self-pay | Admitting: Cardiovascular Disease

## 2018-04-21 ENCOUNTER — Other Ambulatory Visit: Payer: Self-pay | Admitting: Family Medicine

## 2018-04-21 NOTE — Telephone Encounter (Signed)
Ok to refill??  Last office visit 02/03/2018.  Last refill 12/22/2017.

## 2018-04-30 ENCOUNTER — Other Ambulatory Visit: Payer: Self-pay | Admitting: *Deleted

## 2018-04-30 MED ORDER — OXYCODONE-ACETAMINOPHEN 5-325 MG PO TABS: 1.0000 | ORAL_TABLET | Freq: Three times a day (TID) | ORAL | 0 refills | Status: DC | PRN

## 2018-04-30 NOTE — Telephone Encounter (Signed)
Received call from patient.   Requested refill on Oxycodone/APAP.  Ok to refill??  Last office visit 02/03/2018.  Last refill 04/02/2018.

## 2018-05-12 ENCOUNTER — Telehealth: Payer: Self-pay | Admitting: Student

## 2018-05-12 NOTE — Telephone Encounter (Signed)
Virtual Visit Pre-Appointment Phone Call  "(Name), I am calling you today to discuss your upcoming appointment. We are currently trying to limit exposure to the virus that causes COVID-19 by seeing patients at home rather than in the office."  1. "What is the BEST phone number to call the day of the visit?" - include this in appointment notes  2. Do you have or have access to (through a family member/friend) a smartphone with video capability that we can use for your visit?" a. If yes - list this number in appt notes as cell (if different from BEST phone #) and list the appointment type as a VIDEO visit in appointment notes b. If no - list the appointment type as a PHONE visit in appointment notes  3. Confirm consent - "In the setting of the current Covid19 crisis, you are scheduled for a (phone or video) visit with your provider on (date) at (time).  Just as we do with many in-office visits, in order for you to participate in this visit, we must obtain consent.  If you'd like, I can send this to your mychart (if signed up) or email for you to review.  Otherwise, I can obtain your verbal consent now.  All virtual visits are billed to your insurance company just like a normal visit would be.  By agreeing to a virtual visit, we'd like you to understand that the technology does not allow for your provider to perform an examination, and thus may limit your provider's ability to fully assess your condition. If your provider identifies any concerns that need to be evaluated in person, we will make arrangements to do so.  Finally, though the technology is pretty good, we cannot assure that it will always work on either your or our end, and in the setting of a video visit, we may have to convert it to a phone-only visit.  In either situation, we cannot ensure that we have a secure connection.  Are you willing to proceed?" STAFF: Did the patient verbally acknowledge consent to telehealth visit? Document  YES/NO here: Yes  4. Advise patient to be prepared - "Two hours prior to your appointment, go ahead and check your blood pressure, pulse, oxygen saturation, and your weight (if you have the equipment to check those) and write them all down. When your visit starts, your provider will ask you for this information. If you have an Apple Watch or Kardia device, please plan to have heart rate information ready on the day of your appointment. Please have a pen and paper handy nearby the day of the visit as well."  5. Give patient instructions for MyChart download to smartphone OR [email or web address] as below if video visit (depending on what platform provider is using)  6. Inform patient they will receive a phone call 15 minutes prior to their appointment time (may be from unknown caller ID) so they should be prepared to answer    TELEPHONE CALL NOTE  Neville Walston has been deemed a candidate for a follow-up tele-health visit to limit community exposure during the Covid-19 pandemic. I spoke with the patient via phone to ensure availability of phone/video source, confirm preferred email & phone number, and discuss instructions and expectations.  I reminded Dekendrick Uzelac to be prepared with any vital sign and/or heart rhythm information that could potentially be obtained via home monitoring, at the time of his visit. I reminded Salah Nakamura to expect a phone call prior to his visit.  Orinda Kenner 05/12/2018 12:42 PM

## 2018-05-15 ENCOUNTER — Other Ambulatory Visit: Payer: Self-pay | Admitting: Family Medicine

## 2018-05-20 ENCOUNTER — Telehealth (INDEPENDENT_AMBULATORY_CARE_PROVIDER_SITE_OTHER): Payer: Medicare HMO | Admitting: Student

## 2018-05-20 ENCOUNTER — Encounter: Payer: Self-pay | Admitting: Student

## 2018-05-20 VITALS — BP 140/82 | HR 72 | Ht 67.0 in | Wt 167.0 lb

## 2018-05-20 DIAGNOSIS — E785 Hyperlipidemia, unspecified: Secondary | ICD-10-CM

## 2018-05-20 DIAGNOSIS — I10 Essential (primary) hypertension: Secondary | ICD-10-CM

## 2018-05-20 DIAGNOSIS — I25810 Atherosclerosis of coronary artery bypass graft(s) without angina pectoris: Secondary | ICD-10-CM

## 2018-05-20 DIAGNOSIS — Z7189 Other specified counseling: Secondary | ICD-10-CM

## 2018-05-20 MED ORDER — AMLODIPINE BESYLATE 5 MG PO TABS: 5.0000 mg | ORAL_TABLET | Freq: Every day | ORAL | 3 refills | Status: DC

## 2018-05-20 MED ORDER — METOPROLOL SUCCINATE ER 50 MG PO TB24: 50.0000 mg | ORAL_TABLET | Freq: Every day | ORAL | 3 refills | Status: DC

## 2018-05-20 NOTE — Patient Instructions (Signed)
Medication Instructions:  Your physician recommends that you continue on your current medications as directed. Please refer to the Current Medication list given to you today.   Labwork: Your physician recommends that you return for lab work in: Fasting (Lipid, CMET)   Testing/Procedures: NONE  Follow-Up: Your physician wants you to follow-up in: 6 Months with Dr. Bronson Ing in the [place] office.You will receive a reminder letter in the mail two months in advance. If you don't receive a letter, please call our office to schedule the follow-up appointment.   Any Other Special Instructions Will Be Listed Below (If Applicable).     If you need a refill on your cardiac medications before your next appointment, please call your pharmacy.  Thank you for choosing [facility]!

## 2018-05-20 NOTE — Progress Notes (Signed)
Virtual Visit via Telephone Note   This visit type was conducted due to national recommendations for restrictions regarding the COVID-19 Pandemic (e.g. social distancing) in an effort to limit this patient's exposure and mitigate transmission in our community.  Due to his co-morbid illnesses, this patient is at least at moderate risk for complications without adequate follow up.  This format is felt to be most appropriate for this patient at this time.  The patient did not have access to video technology/had technical difficulties with video requiring transitioning to audio format only (telephone).  All issues noted in this document were discussed and addressed.  No physical exam could be performed with this format.  Please refer to the patient's chart for his  consent to telehealth for [facility].   Date:  05/20/2018   ID:  Stephen Booker, DOB [birth year], MRN [id number]  Patient Location: Home Provider Location: Home  PCP:  Alycia Rossetti, MD  Cardiologist:  Kate Sable, MD  Electrophysiologist:  None   Evaluation Performed:  Follow-Up Visit  Chief Complaint: No specific concerns; Follow-Up Visit  History of Present Illness:    Stephen Booker is a 68 y.o. male with past medical history of CAD (s/p CABG in 05/2012 with NST in 02/2017 showing no significant ischemia), HTN, HLD, and Hypothyroidism who presents for a 7-month follow-up telehealth visit.   He was last examined by Dr. Bronson Ing in 11/2017 and denied any recent chest pain or dyspnea on exertion at that time. Reported having generalized fatigue and was not able to exercise due to chronic back pain and arthritis. Toprol-XL was reduced to 50mg  daily and given his intolerance to statin therapy, he was started on Repatha.   In talking with the patient today, he reports overall doing well from a cardiac perspective since his last office visit. He is not overly active at baseline secondary to chronic knee pain and back pain but  is able to walk around his yard and the grocery store without any anginal symptoms. Denies any recent chest pain, dyspnea on exertion, orthopnea, PND, or edema. Weight has been stable on his home scales.   He reports good compliance with his medications. Tolerating Repatha well and denies any complications with the injections.  The patient does not have symptoms concerning for COVID-19 infection (fever, chills, cough, or new shortness of breath).    Past Medical History:  Diagnosis Date  . Arthritis   . Back pain   . Coronary artery disease    a. s/p CABG in 05/2012 b. NST in 02/2017 showing no significant ischemia  . Diverticulitis   . Hyperlipidemia   . Hypertension   . S/P triple vessel bypass   . Thyroid disease    Past Surgical History:  Procedure Laterality Date  . CORONARY ARTERY BYPASS GRAFT    . INGUINAL HERNIA REPAIR    . KNEE ARTHROSCOPY Right    x2  . KNEE ARTHROSCOPY Right    has had 2 right knee arthroscopies one when he was in his 58s the other in his 20s  . UMBILICAL HERNIA REPAIR       Current Meds  Medication Sig  . amLODipine (NORVASC) 5 MG tablet Take 1 tablet (5 mg total) by mouth daily.  Marland Kitchen aspirin EC 81 MG tablet Take 81 mg by mouth daily.  . Evolocumab (REPATHA SURECLICK) 884 MG/ML SOAJ Inject 140 mg into the skin every 14 (fourteen) days.  Marland Kitchen gemfibrozil (LOPID) 600 MG tablet TAKE 1 TABLET BY MOUTH  TWICE DAILY BEFORE MEALS  . levothyroxine (SYNTHROID) 50 MCG tablet TAKE 1 TABLET(50 MCG) BY MOUTH DAILY BEFORE BREAKFAST  . metoprolol succinate (TOPROL-XL) 50 MG 24 hr tablet Take 1 tablet (50 mg total) by mouth daily. Take with or immediately following a meal.  . nitroGLYCERIN (NITROSTAT) 0.4 MG SL tablet Place 1 tablet (0.4 mg total) under the tongue every 5 (five) minutes as needed for chest pain.  Marland Kitchen oxyCODONE-acetaminophen (PERCOCET/ROXICET) 5-325 MG tablet Take 1 tablet by mouth every 8 (eight) hours as needed.  . [DISCONTINUED] amLODipine (NORVASC) 5  MG tablet TAKE 1 TABLET BY MOUTH EVERY DAY  . [DISCONTINUED] metoprolol succinate (TOPROL-XL) 50 MG 24 hr tablet Take 1 tablet (50 mg total) by mouth daily. Take with or immediately following a meal.     Allergies:   Statins and Penicillins   Social History   Tobacco Use  . Smoking status: Former Smoker    Last attempt to quit: 01/06/1985    Years since quitting: 33.3  . Smokeless tobacco: Never Used  Substance Use Topics  . Alcohol use: No  . Drug use: No     Family Hx: The patient's family history includes Arthritis in his mother and sister; COPD in his brother; Cancer in his brother, father, and maternal aunt; Drug abuse in his sister; Early death in his brother and sister; Heart disease in his mother.  ROS:   Please see the history of present illness.     All other systems reviewed and are negative.   Prior CV studies:   The following studies were reviewed today:  NST: 02/2017  No diagnostic ST segment changes to indicate ischemia. No chest pain reported. Mild hypertensive response. No arrhythmias. Low risk Duke treadmill score of 4.5.  Blood pressure demonstrated a hypertensive response to exercise.  Small, mild intensity, fixed inferior defect that is most prominent on rest imaging and consistent with soft tissue attenuation, less likely scar. No definitive ischemia.  This is a low risk study.  Nuclear stress EF: 64%.  Labs/Other Tests and Data Reviewed:    EKG:  No ECG reviewed.  Recent Labs: 12/18/2017: ALT 14; BUN 20; Creat 0.99; Hemoglobin 14.7; Platelets 360; Potassium 4.4; Sodium 142; TSH 0.56   Recent Lipid Panel Lab Results  Component Value Date/Time   CHOL 257 (H) 12/17/2017 02:26 PM   TRIG 178 (H) 12/17/2017 02:26 PM   HDL 53 12/17/2017 02:26 PM   CHOLHDL 4.8 12/17/2017 02:26 PM   CHOLHDL 3.8 02/10/2017 02:25 PM   LDLCALC 168 (H) 12/17/2017 02:26 PM   LDLCALC 117 (H) 02/10/2017 02:25 PM    Wt Readings from Last 3 Encounters:  05/20/18 167 lb  (75.8 kg)  02/17/18 173 lb (78.5 kg)  02/03/18 173 lb (78.5 kg)     Objective:    Vital Signs:  BP 140/82   Pulse 72   Ht 5\' 7"  (1.702 m)   Wt 167 lb (75.8 kg)   BMI 26.16 kg/m    General: Pleasant male sounding in NAD Psych: Normal affect. Neuro: Alert and oriented X 3.  Lungs:  Resp regular and unlabored while talking on the phone.   ASSESSMENT & PLAN:    1. CAD - he is s/p CABG in 05/2012 with recent NST in 02/2017 showing no significant ischemia and being a low-risk study as outlined above.  - he denies any recent chest pain or dyspnea on exertion. Not overly active at baseline secondary to chronic back pain but no anginal symptoms  when performing his routine activities.  - continue ASA 81mg  daily, Toprol-XL 50mg  daily, and Repatha.   2. HLD - he was previously intolerant to multiple statins and was started on Repatha in 01/2018. Tolerating well and denies any complications. Also on Gemfibrozil. Due for repeat FLP and LFT's. Will enter orders today and patient will obtain once COVID-19 situation improves.   3. HTN - he reports BP has been well-controlled when checked at home. At 140/82 when checked earlier today. Continue Amlodipine 5mg  daily and Toprol-XL 50mg  daily. Amlodipine could be further titrated to 10mg  daily if this remains above goal.   4. COVID-19 Education - The signs and symptoms of COVID-19 were discussed with the patient. The importance of social distancing was discussed today.  Time:   Today, I have spent 18 minutes with the patient with telehealth technology discussing the above problems.     Medication Adjustments/Labs and Tests Ordered: Current medicines are reviewed at length with the patient today.  Concerns regarding medicines are outlined above.   Tests Ordered: Orders Placed This Encounter  Procedures  . Lipid Profile  . Comprehensive Metabolic Panel (CMET)    Medication Changes: Meds ordered this encounter  Medications  . amLODipine  (NORVASC) 5 MG tablet    Sig: Take 1 tablet (5 mg total) by mouth daily.    Dispense:  90 tablet    Refill:  3    Order Specific Question:   Supervising Provider    Answer:   Dorothy Spark [id number]  . metoprolol succinate (TOPROL-XL) 50 MG 24 hr tablet    Sig: Take 1 tablet (50 mg total) by mouth daily. Take with or immediately following a meal.    Dispense:  90 tablet    Refill:  3    Order Specific Question:   Supervising Provider    Answer:   Dorothy Spark [[id number]]    Disposition:  Follow up with Dr. Bronson Ing in 6 months.   Signed, Erma Heritage, PA-C  05/20/2018 2:08 PM    [facility]

## 2018-05-27 ENCOUNTER — Other Ambulatory Visit: Payer: Self-pay | Admitting: Family Medicine

## 2018-05-27 NOTE — Telephone Encounter (Signed)
Patient called in requesting a refill on his pain medciation. Last refilled on 04/30/2018. Last office visit 02/03/2018

## 2018-05-28 MED ORDER — OXYCODONE-ACETAMINOPHEN 5-325 MG PO TABS: 1.0000 | ORAL_TABLET | Freq: Three times a day (TID) | ORAL | 0 refills | Status: DC | PRN

## 2018-06-04 ENCOUNTER — Ambulatory Visit: Payer: Medicare HMO | Admitting: Family Medicine

## 2018-06-06 ENCOUNTER — Other Ambulatory Visit: Payer: Self-pay | Admitting: Cardiovascular Disease

## 2018-06-07 ENCOUNTER — Encounter: Payer: Self-pay | Admitting: Family Medicine

## 2018-06-07 ENCOUNTER — Other Ambulatory Visit: Payer: Self-pay

## 2018-06-07 ENCOUNTER — Ambulatory Visit (INDEPENDENT_AMBULATORY_CARE_PROVIDER_SITE_OTHER): Payer: Medicare HMO | Admitting: Family Medicine

## 2018-06-07 VITALS — BP 138/82 | HR 73 | Temp 98.3°F | Resp 16 | Ht 67.0 in | Wt 170.5 lb

## 2018-06-07 DIAGNOSIS — Z125 Encounter for screening for malignant neoplasm of prostate: Secondary | ICD-10-CM | POA: Diagnosis not present

## 2018-06-07 DIAGNOSIS — I10 Essential (primary) hypertension: Secondary | ICD-10-CM | POA: Diagnosis not present

## 2018-06-07 DIAGNOSIS — E782 Mixed hyperlipidemia: Secondary | ICD-10-CM | POA: Diagnosis not present

## 2018-06-07 DIAGNOSIS — E039 Hypothyroidism, unspecified: Secondary | ICD-10-CM | POA: Diagnosis not present

## 2018-06-07 DIAGNOSIS — G894 Chronic pain syndrome: Secondary | ICD-10-CM | POA: Diagnosis not present

## 2018-06-07 DIAGNOSIS — I25708 Atherosclerosis of coronary artery bypass graft(s), unspecified, with other forms of angina pectoris: Secondary | ICD-10-CM

## 2018-06-07 NOTE — Patient Instructions (Addendum)
Come in fasting for labs this week Continue current medications  F/U 4 month wellness visit

## 2018-06-07 NOTE — Progress Notes (Signed)
   Subjective:    Patient ID: Stephen Booker, male    DOB: 1950/04/02, 68 y.o.   MRN: [id number]  Patient presents for Follow-up    CAD/Hyperlipdemia- taking Repatha/ Lopid , no longer on statin drug   Chronic pain medication- taking as prsecribed has bottle with him today   HTN- taking norvasc and metoprolol    Hypothyroidism- taking synthroid first thing in AM      Request PSA screening   Pt saw orthopedics before COVID-19 for knee pain, using compression knee sleeve for now   Review Of Systems:  GEN- denies fatigue, fever, weight loss,weakness, recent illness HEENT- denies eye drainage, change in vision, nasal discharge, CVS- denies chest pain, palpitations RESP- denies SOB, cough, wheeze ABD- denies N/V, change in stools, abd pain GU- denies dysuria, hematuria, dribbling, incontinence MSK- + joint pain, muscle aches, injury Neuro- denies headache, dizziness, syncope, seizure activity       Objective:    BP 138/82   Pulse 73   Temp 98.3 F (36.8 C) (Oral)   Resp 16   Ht 5\' 7"  (1.702 m)   Wt 170 lb 8 oz (77.3 kg)   SpO2 95%   BMI 26.70 kg/m  GEN- NAD, alert and oriented x3,walks with limp right knee HEENT- PERRL, EOMI, non injected sclera, pink conjunctiva, MMM, oropharynx clear CVS- RRR, no murmur RESP-CTAB ABD-NABS,soft,NT,ND EXT- No edema Pulses- Radial  2+        Assessment & Plan:      Problem List Items Addressed This Visit      Unprioritized   CAD (coronary artery disease) - Primary    Followed by cardiology Asymptomatic, no use of NTG Check lipids and CMET On repatha      Relevant Orders   CBC with Differential/Platelet   Comprehensive metabolic panel   Chronic pain syndrome    Doing well on meds, holding on knee intervention at this time No abuse or overuse of medication noted      Hyperlipidemia   Relevant Orders   Lipid panel   Hypertension    Fairly well controlled no changes       Hypothyroidism    Recheck TSH, continue  thyroid replacement  He did states he may move back [place] with children, if so we can provide him with records in hand       Relevant Orders   TSH    Other Visit Diagnoses    Screening PSA (prostate specific antigen)       Relevant Orders   PSA      Note: This dictation was prepared with Dragon dictation along with smaller phrase technology. Any transcriptional errors that result from this process are unintentional.

## 2018-06-08 ENCOUNTER — Encounter: Payer: Self-pay | Admitting: Family Medicine

## 2018-06-08 NOTE — Assessment & Plan Note (Signed)
Fairly well controlled no changes

## 2018-06-08 NOTE — Assessment & Plan Note (Signed)
Doing well on meds, holding on knee intervention at this time No abuse or overuse of medication noted

## 2018-06-08 NOTE — Assessment & Plan Note (Signed)
Recheck TSH, continue thyroid replacement  He did states he may move back [place] with children, if so we can provide him with records in hand

## 2018-06-08 NOTE — Assessment & Plan Note (Signed)
Followed by cardiology Asymptomatic, no use of NTG Check lipids and CMET On repatha

## 2018-06-11 ENCOUNTER — Other Ambulatory Visit: Payer: Medicare HMO

## 2018-06-11 ENCOUNTER — Other Ambulatory Visit: Payer: Self-pay

## 2018-06-11 DIAGNOSIS — E039 Hypothyroidism, unspecified: Secondary | ICD-10-CM | POA: Diagnosis not present

## 2018-06-11 DIAGNOSIS — Z125 Encounter for screening for malignant neoplasm of prostate: Secondary | ICD-10-CM | POA: Diagnosis not present

## 2018-06-11 DIAGNOSIS — E782 Mixed hyperlipidemia: Secondary | ICD-10-CM

## 2018-06-11 DIAGNOSIS — I25708 Atherosclerosis of coronary artery bypass graft(s), unspecified, with other forms of angina pectoris: Secondary | ICD-10-CM | POA: Diagnosis not present

## 2018-06-12 LAB — COMPREHENSIVE METABOLIC PANEL
AG Ratio: 1.9 (calc) (ref 1.0–2.5)
ALT: 13 U/L (ref 9–46)
AST: 16 U/L (ref 10–35)
Albumin: 4.6 g/dL (ref 3.6–5.1)
Alkaline phosphatase (APISO): 64 U/L (ref 35–144)
BUN: 14 mg/dL (ref 7–25)
CO2: 26 mmol/L (ref 20–32)
Calcium: 9.5 mg/dL (ref 8.6–10.3)
Chloride: 106 mmol/L (ref 98–110)
Creat: 0.88 mg/dL (ref 0.70–1.25)
Globulin: 2.4 g/dL (calc) (ref 1.9–3.7)
Glucose, Bld: 119 mg/dL — ABNORMAL HIGH (ref 65–99)
Potassium: 4.5 mmol/L (ref 3.5–5.3)
Sodium: 142 mmol/L (ref 135–146)
Total Bilirubin: 0.4 mg/dL (ref 0.2–1.2)
Total Protein: 7 g/dL (ref 6.1–8.1)

## 2018-06-12 LAB — CBC WITH DIFFERENTIAL/PLATELET
Absolute Monocytes: 726 cells/uL (ref 200–950)
Basophils Absolute: 41 cells/uL (ref 0–200)
Basophils Relative: 0.7 %
Eosinophils Absolute: 454 cells/uL (ref 15–500)
Eosinophils Relative: 7.7 %
HCT: 41.3 % (ref 38.5–50.0)
Hemoglobin: 14.2 g/dL (ref 13.2–17.1)
Lymphs Abs: 1971 cells/uL (ref 850–3900)
MCH: 28.8 pg (ref 27.0–33.0)
MCHC: 34.4 g/dL (ref 32.0–36.0)
MCV: 83.8 fL (ref 80.0–100.0)
MPV: 9 fL (ref 7.5–12.5)
Monocytes Relative: 12.3 %
Neutro Abs: 2708 cells/uL (ref 1500–7800)
Neutrophils Relative %: 45.9 %
Platelets: 347 10*3/uL (ref 140–400)
RBC: 4.93 10*6/uL (ref 4.20–5.80)
RDW: 12.3 % (ref 11.0–15.0)
Total Lymphocyte: 33.4 %
WBC: 5.9 10*3/uL (ref 3.8–10.8)

## 2018-06-12 LAB — PSA: PSA: 1.4 ng/mL (ref <=4.0)

## 2018-06-12 LAB — LIPID PANEL
Cholesterol: 136 mg/dL (ref <200)
HDL: 55 mg/dL (ref >=40)
LDL Cholesterol (Calc): 58 mg/dL (calc)
Non-HDL Cholesterol (Calc): 81 mg/dL (calc) (ref <130)
Total CHOL/HDL Ratio: 2.5 (calc) (ref <5.0)
Triglycerides: 148 mg/dL (ref <150)

## 2018-06-12 LAB — TSH: TSH: 1.02 mIU/L (ref 0.40–4.50)

## 2018-06-23 ENCOUNTER — Other Ambulatory Visit: Payer: Self-pay | Admitting: *Deleted

## 2018-06-23 MED ORDER — OXYCODONE-ACETAMINOPHEN 5-325 MG PO TABS: 1.0000 | ORAL_TABLET | Freq: Three times a day (TID) | ORAL | 0 refills | Status: DC | PRN

## 2018-06-23 NOTE — Telephone Encounter (Signed)
Received call from patient.   Requested refill on Oxycodone/APAP.   Ok to refill??  Last office visit 06/07/2018.  Last refill 05/28/2018.

## 2018-07-01 ENCOUNTER — Other Ambulatory Visit: Payer: Self-pay

## 2018-07-01 MED ORDER — NITROGLYCERIN 0.4 MG SL SUBL: 0.4000 mg | SUBLINGUAL_TABLET | SUBLINGUAL | 3 refills | Status: AC | PRN

## 2018-07-01 NOTE — Telephone Encounter (Signed)
Refilled NTG to [facility]

## 2018-07-05 ENCOUNTER — Telehealth: Payer: Self-pay | Admitting: *Deleted

## 2018-07-05 NOTE — Telephone Encounter (Signed)
Received letter from patient.   Reports that he will be traveling out of the state through [place], [place], [place], and [place].  Inquired to if prescription for pain medication can be sent to pharmacy in July in [place]. Advised that when refill is due, he can call and leave info on VM.

## 2018-07-21 ENCOUNTER — Other Ambulatory Visit: Payer: Self-pay | Admitting: *Deleted

## 2018-07-21 MED ORDER — OXYCODONE-ACETAMINOPHEN 5-325 MG PO TABS: 1.0000 | ORAL_TABLET | Freq: Three times a day (TID) | ORAL | 0 refills | Status: DC | PRN

## 2018-07-21 NOTE — Telephone Encounter (Signed)
Received call from patient.   Requested refill on Oxycodone/APAP.   Ok to refill??  Last office visit/ refill 06/23/2018.

## 2018-08-18 ENCOUNTER — Other Ambulatory Visit: Payer: Self-pay | Admitting: *Deleted

## 2018-08-18 MED ORDER — OXYCODONE-ACETAMINOPHEN 5-325 MG PO TABS: 1.0000 | ORAL_TABLET | Freq: Three times a day (TID) | ORAL | 0 refills | Status: DC | PRN

## 2018-08-18 NOTE — Telephone Encounter (Signed)
Received call from patient.    Reports that he is currently at his Niece's home in [place] and requested refill on Oxycodone to the [facility] in [facility], [phone]~ telephone.   Ok to refill??  Last office visit 06/07/2018.  Last refill 07/21/2018.

## 2018-09-15 ENCOUNTER — Other Ambulatory Visit: Payer: Self-pay | Admitting: *Deleted

## 2018-09-15 MED ORDER — OXYCODONE-ACETAMINOPHEN 5-325 MG PO TABS: 1.0000 | ORAL_TABLET | Freq: Three times a day (TID) | ORAL | 0 refills | Status: DC | PRN

## 2018-09-15 NOTE — Telephone Encounter (Signed)
Received call from patient.   Requested refill on Oxycodone/APAP to be sent to pharmacy in [facility].   Ok to refill??  Last office visit 06/07/2018.  Last refill 08/18/2018.

## 2018-10-11 ENCOUNTER — Other Ambulatory Visit: Payer: Self-pay | Admitting: *Deleted

## 2018-10-11 NOTE — Telephone Encounter (Signed)
Received call from patient.   Reports that he has returned home to [place]. Requested refill on Oxycodone/APAP.  Ok to refill??  Last office visit 06/07/2018.  Last refill 09/15/2018.

## 2018-10-12 MED ORDER — OXYCODONE-ACETAMINOPHEN 5-325 MG PO TABS: 1.0000 | ORAL_TABLET | Freq: Three times a day (TID) | ORAL | 0 refills | Status: DC | PRN

## 2018-11-09 ENCOUNTER — Other Ambulatory Visit: Payer: Self-pay

## 2018-11-09 MED ORDER — OXYCODONE-ACETAMINOPHEN 5-325 MG PO TABS: 1.0000 | ORAL_TABLET | Freq: Three times a day (TID) | ORAL | 0 refills | Status: DC | PRN

## 2018-11-09 NOTE — Telephone Encounter (Signed)
Requested Prescriptions   Pending Prescriptions Disp Refills  . oxyCODONE-acetaminophen (PERCOCET/ROXICET) 5-325 MG tablet 90 tablet 0    Sig: Take 1 tablet by mouth every 8 (eight) hours as needed.    Last OV 06/07/2018  Last written 10/12/2018

## 2018-11-18 ENCOUNTER — Other Ambulatory Visit: Payer: Self-pay

## 2018-11-18 MED ORDER — METOPROLOL SUCCINATE ER 50 MG PO TB24: 50.0000 mg | ORAL_TABLET | Freq: Every day | ORAL | 3 refills | Status: DC

## 2018-11-18 NOTE — Telephone Encounter (Signed)
Refilled toprol xl

## 2018-12-06 ENCOUNTER — Other Ambulatory Visit: Payer: Self-pay | Admitting: *Deleted

## 2018-12-06 MED ORDER — OXYCODONE-ACETAMINOPHEN 5-325 MG PO TABS: 1.0000 | ORAL_TABLET | Freq: Three times a day (TID) | ORAL | 0 refills | Status: DC | PRN

## 2018-12-06 NOTE — Telephone Encounter (Signed)
Received call from patient.   Requested refill on Oxycodone/APAP.   Ok to refill??  Last office visit 06/07/2018.  Last refill 11/09/2018.

## 2018-12-08 ENCOUNTER — Other Ambulatory Visit: Payer: Self-pay

## 2018-12-08 MED ORDER — LEVOTHYROXINE SODIUM 50 MCG PO TABS: ORAL_TABLET | ORAL | 2 refills | Status: DC

## 2019-01-05 ENCOUNTER — Other Ambulatory Visit: Payer: Self-pay | Admitting: *Deleted

## 2019-01-05 MED ORDER — OXYCODONE-ACETAMINOPHEN 5-325 MG PO TABS: 1.0000 | ORAL_TABLET | Freq: Three times a day (TID) | ORAL | 0 refills | Status: DC | PRN

## 2019-01-05 NOTE — Telephone Encounter (Signed)
Received call from patient.   Requested refill on Oxycodone/APAP to local pharmacy.   Ok to refill??  Last office visit 06/07/2018. Last refill 12/06/2018.  Of note, patient states that niece with cancer did pass away last week. Also states that patient sister passed away last night, so he may need to be out of the state again for her funeral. States that he will not be leaving for a few days. Requested refill to be sent to [facility].

## 2019-02-01 ENCOUNTER — Other Ambulatory Visit: Payer: Self-pay | Admitting: *Deleted

## 2019-02-01 MED ORDER — LEVOTHYROXINE SODIUM 50 MCG PO TABS: ORAL_TABLET | ORAL | 2 refills | Status: DC

## 2019-02-01 MED ORDER — OXYCODONE-ACETAMINOPHEN 5-325 MG PO TABS: 1.0000 | ORAL_TABLET | Freq: Three times a day (TID) | ORAL | 0 refills | Status: DC | PRN

## 2019-02-01 NOTE — Telephone Encounter (Signed)
Received call from patient.   Requested refill on Oxycodone/APAP.   Ok to refill??  Last office visit 06/07/2018.  Last refill 01/05/2019.

## 2019-02-11 ENCOUNTER — Other Ambulatory Visit: Payer: Self-pay

## 2019-02-11 MED ORDER — REPATHA SURECLICK 140 MG/ML ~~LOC~~ SOAJ: 140.0000 mg | SUBCUTANEOUS | 11 refills | Status: DC

## 2019-02-11 NOTE — Telephone Encounter (Signed)
Refilled repatha  ?

## 2019-02-15 ENCOUNTER — Other Ambulatory Visit: Payer: Self-pay

## 2019-02-15 ENCOUNTER — Telehealth: Payer: Self-pay | Admitting: Urology

## 2019-02-15 DIAGNOSIS — N529 Male erectile dysfunction, unspecified: Secondary | ICD-10-CM

## 2019-02-15 NOTE — Telephone Encounter (Signed)
Patient states he has a question about a medication refill. He requests a return call.

## 2019-02-16 ENCOUNTER — Other Ambulatory Visit: Payer: Self-pay

## 2019-02-16 DIAGNOSIS — N529 Male erectile dysfunction, unspecified: Secondary | ICD-10-CM

## 2019-02-16 MED ORDER — SILDENAFIL CITRATE 20 MG PO TABS: ORAL_TABLET | ORAL | 1 refills | Status: DC

## 2019-02-16 NOTE — Telephone Encounter (Signed)
Refill sent in per pt request to pharmacy. Pt appt given to see Dr. Jeffie Pollock next available. Dr. Jeffie Pollock agreed to refill pending f/u appt made.

## 2019-03-02 ENCOUNTER — Other Ambulatory Visit: Payer: Self-pay | Admitting: *Deleted

## 2019-03-02 MED ORDER — OXYCODONE-ACETAMINOPHEN 5-325 MG PO TABS: 1.0000 | ORAL_TABLET | Freq: Three times a day (TID) | ORAL | 0 refills | Status: DC | PRN

## 2019-03-02 NOTE — Telephone Encounter (Signed)
Received call from patient.   Requested refill on Oxycodone/APAP.   Ok to refill??  Last office visit 06/07/2018.  Last refill 02/01/2019.

## 2019-03-29 ENCOUNTER — Other Ambulatory Visit: Payer: Self-pay | Admitting: *Deleted

## 2019-03-29 MED ORDER — OXYCODONE-ACETAMINOPHEN 5-325 MG PO TABS: 1.0000 | ORAL_TABLET | Freq: Three times a day (TID) | ORAL | 0 refills | Status: DC | PRN

## 2019-03-29 NOTE — Telephone Encounter (Signed)
Received call from patient.   Requested refill on Oxycodone/APAP.   Ok to refill??  Last office visit 06/07/2018.  Last refill 03/02/2019.

## 2019-04-26 ENCOUNTER — Other Ambulatory Visit: Payer: Self-pay | Admitting: Family Medicine

## 2019-04-26 MED ORDER — OXYCODONE-ACETAMINOPHEN 5-325 MG PO TABS: 1.0000 | ORAL_TABLET | Freq: Three times a day (TID) | ORAL | 0 refills | Status: DC | PRN

## 2019-04-26 NOTE — Telephone Encounter (Signed)
Last refilled on: 03/29/2019 Last office visit: 06/07/2018

## 2019-04-28 DIAGNOSIS — Z85828 Personal history of other malignant neoplasm of skin: Secondary | ICD-10-CM | POA: Diagnosis not present

## 2019-04-28 DIAGNOSIS — L570 Actinic keratosis: Secondary | ICD-10-CM | POA: Diagnosis not present

## 2019-04-28 DIAGNOSIS — L812 Freckles: Secondary | ICD-10-CM | POA: Diagnosis not present

## 2019-04-28 DIAGNOSIS — L821 Other seborrheic keratosis: Secondary | ICD-10-CM | POA: Diagnosis not present

## 2019-04-28 DIAGNOSIS — L820 Inflamed seborrheic keratosis: Secondary | ICD-10-CM | POA: Diagnosis not present

## 2019-04-29 ENCOUNTER — Encounter: Payer: Self-pay | Admitting: Urology

## 2019-04-29 ENCOUNTER — Other Ambulatory Visit: Payer: Self-pay

## 2019-04-29 ENCOUNTER — Ambulatory Visit (INDEPENDENT_AMBULATORY_CARE_PROVIDER_SITE_OTHER): Payer: Medicare HMO | Admitting: Urology

## 2019-04-29 VITALS — BP 132/77 | HR 80 | Temp 98.5°F | Ht 67.0 in | Wt 170.0 lb

## 2019-04-29 DIAGNOSIS — N401 Enlarged prostate with lower urinary tract symptoms: Secondary | ICD-10-CM | POA: Diagnosis not present

## 2019-04-29 DIAGNOSIS — N529 Male erectile dysfunction, unspecified: Secondary | ICD-10-CM

## 2019-04-29 DIAGNOSIS — N138 Other obstructive and reflux uropathy: Secondary | ICD-10-CM

## 2019-04-29 DIAGNOSIS — N5201 Erectile dysfunction due to arterial insufficiency: Secondary | ICD-10-CM | POA: Diagnosis not present

## 2019-04-29 DIAGNOSIS — R351 Nocturia: Secondary | ICD-10-CM | POA: Diagnosis not present

## 2019-04-29 LAB — POCT URINALYSIS DIPSTICK
Bilirubin, UA: NEGATIVE
Blood, UA: NEGATIVE
Glucose, UA: NEGATIVE
Ketones, UA: NEGATIVE
Leukocytes, UA: NEGATIVE
Nitrite, UA: NEGATIVE
Protein, UA: NEGATIVE
Spec Grav, UA: >=1.03 — AB (ref 1.010–1.025)
Urobilinogen, UA: NEGATIVE E.U./dL — AB
pH, UA: 5 (ref 5.0–8.0)

## 2019-04-29 MED ORDER — SILDENAFIL CITRATE 20 MG PO TABS: ORAL_TABLET | ORAL | 11 refills | Status: DC

## 2019-04-29 MED ORDER — TADALAFIL 5 MG PO TABS: 5.0000 mg | ORAL_TABLET | Freq: Every day | ORAL | 11 refills | Status: DC | PRN

## 2019-04-29 NOTE — Progress Notes (Signed)
Subjective:  1. BPH with urinary obstruction   2. Erectile dysfunction due to arterial insufficiency   3. Nocturia   4. Erectile dysfunction, unspecified erectile dysfunction type     Stephen Booker is a 69 year-old male established patient who is here for erectile dysfunction and BPH with BOO.  He first stated noticing pain on approximately 09/07/2006. His symptoms did begin gradually. His symptoms have been stable over the last year.   He does have difficulties achieving an erection. He does have problems maintaining his erections. His erections are straight. He has tried Viagra. It did not work. He has tried penile injection therapy. He used Trimix.   He does not have premature ejaculation. He does not have trouble reaching climax. He does have anxiety because of the symptoms.   04/29/19: Stephen Booker was given tadalafil for daily use at his last visit for treatment of the ED and BPH with nocturia and was going to use the sildenafil in addition as needed.  He was afraid to use the tadalafil and didn't take them.  He continues to have moderate to severe LUTS with an IPSS of 20.  The sildenafil works ok and he prefers if over the trimix.  He last used the trimix in several months.   His PSA in 6/20 was 1.4.    02/05/18: Stephen Booker has used the sildenafil and at a dose of 80mg  he can function but not as well as with the injections.   11/13/17: He has had ED for over 10 years. He is currently using trimix with good results. He had one episode of penile burning after one injection but that resolved. He has done well on a couple of occasions since. He got some Sildenafil from his brother and it seemed to work at 60-80mg .      [place] Name 04/29/19 1400         International Prostate Symptom Score   How often have you had the sensation of not emptying your bladder?  About half the time     How often have you had to urinate less than every two hours?  About half the time     How often have you found you  stopped and started again several times when you urinated?  About half the time     How often have you found it difficult to postpone urination?  Less than 1 in 5 times     How often have you had a weak urinary stream?  More than half the time     How often have you had to strain to start urination?  About half the time     How many times did you typically get up at night to urinate?  3 Times     Total IPSS Score  20       Quality of Life due to urinary symptoms   If you were to spend the rest of your life with your urinary condition just the way it is now how would you feel about that?  Mixed         ROS:  ROS:  A complete review of systems was performed.  All systems are negative except for pertinent findings as noted.   Review of Systems  Musculoskeletal: Positive for back pain and joint pain.  Skin: Positive for itching.    Allergies  Allergen Reactions  . Statins     Myalgia   . Penicillins Rash    Outpatient Encounter  Medications as of 04/29/2019  Medication Sig  . amLODipine (NORVASC) 5 MG tablet Take 1 tablet (5 mg total) by mouth daily.  Marland Kitchen aspirin EC 81 MG tablet Take 81 mg by mouth daily.  . Evolocumab (REPATHA SURECLICK) XX123456 MG/ML SOAJ Inject 140 mg into the skin every 14 (fourteen) days.  Marland Kitchen levothyroxine (SYNTHROID) 50 MCG tablet TAKE 1 TABLET(50 MCG) BY MOUTH DAILY BEFORE BREAKFAST  . metoprolol succinate (TOPROL-XL) 50 MG 24 hr tablet Take 1 tablet (50 mg total) by mouth daily. Take with or immediately following a meal.  . oxyCODONE-acetaminophen (PERCOCET/ROXICET) 5-325 MG tablet Take 1 tablet by mouth every 8 (eight) hours as needed.  . Papav-Phentolamine-Alprostadil 12-1-0.01 MG/ML SOLN by Intracavernosal route.  . sildenafil (REVATIO) 20 MG tablet Take 1-5 tablets by mouth as needed.  . [DISCONTINUED] sildenafil (REVATIO) 20 MG tablet Take 1-5 tablets by mouth as needed.  Marland Kitchen gemfibrozil (LOPID) 600 MG tablet TAKE 1 TABLET BY MOUTH TWICE DAILY BEFORE MEALS  (Patient not taking: Reported on 04/29/2019)  . nitroGLYCERIN (NITROSTAT) 0.4 MG SL tablet Place 1 tablet (0.4 mg total) under the tongue every 5 (five) minutes as needed for chest pain.  . tadalafil (CIALIS) 5 MG tablet Take 1 tablet (5 mg total) by mouth daily as needed for erectile dysfunction.   No facility-administered encounter medications on file as of 04/29/2019.    Past Medical History:  Diagnosis Date  . Arthritis   . Back pain   . Coronary artery disease    a. s/p CABG in 05/2012 b. NST in 02/2017 showing no significant ischemia  . Diverticulitis   . Hyperlipidemia   . Hypertension   . S/P triple vessel bypass   . Thyroid disease     Past Surgical History:  Procedure Laterality Date  . CORONARY ARTERY BYPASS GRAFT    . INGUINAL HERNIA REPAIR    . KNEE ARTHROSCOPY Right    x2  . KNEE ARTHROSCOPY Right    has had 2 right knee arthroscopies one when he was in his 85s the other in his 57s  . UMBILICAL HERNIA REPAIR      Social History   Socioeconomic History  . Marital status: Divorced    Spouse name: Not on file  . Number of children: Not on file  . Years of education: Not on file  . Highest education level: Not on file  Occupational History  . Not on file  Tobacco Use  . Smoking status: Former Smoker    Quit date: 01/06/1985    Years since quitting: 34.3  . Smokeless tobacco: Never Used  Substance and Sexual Activity  . Alcohol use: No  . Drug use: No  . Sexual activity: Not Currently  Other Topics Concern  . Not on file  Social History Narrative  . Not on file   Social Determinants of Health   Financial Resource Strain:   . Difficulty of Paying Living Expenses:   Food Insecurity:   . Worried About Charity fundraiser in the Last Year:   . Arboriculturist in the Last Year:   Transportation Needs:   . Film/video editor (Medical):   Marland Kitchen Lack of Transportation (Non-Medical):   Physical Activity:   . Days of Exercise per Week:   . Minutes of  Exercise per Session:   Stress:   . Feeling of Stress :   Social Connections:   . Frequency of Communication with Friends and Family:   . Frequency of Social  Gatherings with Friends and Family:   . Attends Religious Services:   . Active Member of Clubs or Organizations:   . Attends Archivist Meetings:   Marland Kitchen Marital Status:   Intimate Partner Violence:   . Fear of Current or Ex-Partner:   . Emotionally Abused:   Marland Kitchen Physically Abused:   . Sexually Abused:     Family History  Problem Relation Age of Onset  . Arthritis Mother   . Heart disease Mother   . Cancer Father   . Early death Brother   . Cancer Brother        oral cancer  . COPD Brother   . Arthritis Sister   . Drug abuse Sister   . Early death Sister   . Cancer Maternal Aunt        Colon cancer        Objective: Vitals:   04/29/19 1430  BP: 132/77  Pulse: 80  Temp: 98.5 F (36.9 C)     Physical Exam Vitals reviewed.  Constitutional:      Appearance: Normal appearance.  Neurological:     Mental Status: He is alert.     Lab Results:  Results for orders placed or performed in visit on 04/29/19 (from the past 24 hour(s))  POCT urinalysis dipstick     Status: Abnormal   Collection Time: 04/29/19  2:33 PM  Result Value Ref Range   Color, UA dk yellow    Clarity, UA clear    Glucose, UA Negative Negative   Bilirubin, UA neg    Ketones, UA neg    Spec Grav, UA >=1.030 (A) 1.010 - 1.025   Blood, UA neg    pH, UA 5.0 5.0 - 8.0   Protein, UA Negative Negative   Urobilinogen, UA negative (A) 0.2 or 1.0 E.U./dL   Nitrite, UA neg    Leukocytes, UA Negative Negative   Appearance clear    Odor      BMET No results for input(s): NA, K, CL, CO2, GLUCOSE, BUN, CREATININE, CALCIUM in the last 72 hours. PSA PSA  Date Value Ref Range Status  06/11/2018 1.4 < OR = 4.0 ng/mL Final    Comment:    The total PSA value from this assay system is  standardized against the WHO standard. The test   result will be approximately 20% lower when compared  to the equimolar-standardized total PSA (Beckman  Coulter). Comparison of serial PSA results should be  interpreted with this fact in mind. . This test was performed using the Siemens  chemiluminescent method. Values obtained from  different assay methods cannot be used interchangeably. PSA levels, regardless of value, should not be interpreted as absolute evidence of the presence or absence of disease.    No results found for: TESTOSTERONE    Studies/Results: No results found.    Assessment & Plan: ED.  He continues to respond to sildenafil and has not been using the trimix but he hasn't been too active.  He will try the tadalafil daily.    BPH with BOO.  He has moderate to severe LUTS with a reduced stream.  I have resent the tadalafil for daily use and will give it a try in place of the sildenafil.  He knows to avoid the NTG which he has never taken.     Meds ordered this encounter  Medications  . tadalafil (CIALIS) 5 MG tablet    Sig: Take 1 tablet (5 mg total) by mouth daily  as needed for erectile dysfunction.    Dispense:  30 tablet    Refill:  11  . sildenafil (REVATIO) 20 MG tablet    Sig: Take 1-5 tablets by mouth as needed.    Dispense:  90 tablet    Refill:  11     Orders Placed This Encounter  Procedures  . POCT urinalysis dipstick      Return in about 1 year (around 04/28/2020).   CC: Alycia Rossetti, MD      Irine Seal 04/29/2019

## 2019-05-12 ENCOUNTER — Telehealth: Payer: Self-pay

## 2019-05-12 NOTE — Telephone Encounter (Signed)
  Patient Consent for Virtual Visit         Stephen Booker has provided verbal consent on 05/12/2019 for a virtual visit (video or telephone).   CONSENT FOR VIRTUAL VISIT FOR:  Stephen Booker  By participating in this virtual visit I agree to the following:  I hereby voluntarily request, consent and authorize [facility] and its employed or contracted physicians, physician assistants, nurse practitioners or other licensed health care professionals (the Practitioner), to provide me with telemedicine health care services (the "Services") as deemed necessary by the treating Practitioner. I acknowledge and consent to receive the Services by the Practitioner via telemedicine. I understand that the telemedicine visit will involve communicating with the Practitioner through live audiovisual communication technology and the disclosure of certain medical information by electronic transmission. I acknowledge that I have been given the opportunity to request an in-person assessment or other available alternative prior to the telemedicine visit and am voluntarily participating in the telemedicine visit.  I understand that I have the right to withhold or withdraw my consent to the use of telemedicine in the course of my care at any time, without affecting my right to future care or treatment, and that the Practitioner or I may terminate the telemedicine visit at any time. I understand that I have the right to inspect all information obtained and/or recorded in the course of the telemedicine visit and may receive copies of available information for a reasonable fee.  I understand that some of the potential risks of receiving the Services via telemedicine include:  Marland Kitchen Delay or interruption in medical evaluation due to technological equipment failure or disruption; . Information transmitted may not be sufficient (e.g. poor resolution of images) to allow for appropriate medical decision making by the Practitioner; and/or    . In rare instances, security protocols could fail, causing a breach of personal health information.  Furthermore, I acknowledge that it is my responsibility to provide information about my medical history, conditions and care that is complete and accurate to the best of my ability. I acknowledge that Practitioner's advice, recommendations, and/or decision may be based on factors not within their control, such as incomplete or inaccurate data provided by me or distortions of diagnostic images or specimens that may result from electronic transmissions. I understand that the practice of medicine is not an exact science and that Practitioner makes no warranties or guarantees regarding treatment outcomes. I acknowledge that a copy of this consent can be made available to me via my patient portal ([facility]), or I can request a printed copy by calling the office of [facility].    I understand that my insurance will be billed for this visit.   I have read or had this consent read to me. . I understand the contents of this consent, which adequately explains the benefits and risks of the Services being provided via telemedicine.  . I have been provided ample opportunity to ask questions regarding this consent and the Services and have had my questions answered to my satisfaction. . I give my informed consent for the services to be provided through the use of telemedicine in my medical care

## 2019-05-23 ENCOUNTER — Other Ambulatory Visit: Payer: Self-pay | Admitting: *Deleted

## 2019-05-23 MED ORDER — OXYCODONE-ACETAMINOPHEN 5-325 MG PO TABS: 1.0000 | ORAL_TABLET | Freq: Three times a day (TID) | ORAL | 0 refills | Status: DC | PRN

## 2019-05-23 NOTE — Telephone Encounter (Signed)
Received call from patient.   Requested refill from Oxycodone/APAP.   Ok to refill??  Last office visit 06/07/2018.  Last refill 04/26/2019.

## 2019-06-02 ENCOUNTER — Telehealth (INDEPENDENT_AMBULATORY_CARE_PROVIDER_SITE_OTHER): Payer: Medicare HMO | Admitting: Cardiovascular Disease

## 2019-06-02 ENCOUNTER — Encounter: Payer: Self-pay | Admitting: Cardiovascular Disease

## 2019-06-02 ENCOUNTER — Other Ambulatory Visit: Payer: Self-pay

## 2019-06-02 VITALS — BP 140/80 | Ht 67.0 in | Wt 178.0 lb

## 2019-06-02 DIAGNOSIS — Z789 Other specified health status: Secondary | ICD-10-CM | POA: Diagnosis not present

## 2019-06-02 DIAGNOSIS — E782 Mixed hyperlipidemia: Secondary | ICD-10-CM

## 2019-06-02 DIAGNOSIS — I25708 Atherosclerosis of coronary artery bypass graft(s), unspecified, with other forms of angina pectoris: Secondary | ICD-10-CM | POA: Diagnosis not present

## 2019-06-02 DIAGNOSIS — E039 Hypothyroidism, unspecified: Secondary | ICD-10-CM | POA: Diagnosis not present

## 2019-06-02 DIAGNOSIS — I10 Essential (primary) hypertension: Secondary | ICD-10-CM | POA: Diagnosis not present

## 2019-06-02 NOTE — Progress Notes (Signed)
Virtual Visit via Telephone Note   This visit type was conducted due to national recommendations for restrictions regarding the COVID-19 Pandemic (e.g. social distancing) in an effort to limit this patient's exposure and mitigate transmission in our community.  Due to his co-morbid illnesses, this patient is at least at moderate risk for complications without adequate follow up.  This format is felt to be most appropriate for this patient at this time.  The patient did not have access to video technology/had technical difficulties with video requiring transitioning to audio format only (telephone).  All issues noted in this document were discussed and addressed.  No physical exam could be performed with this format.  Please refer to the patient's chart for his  consent to telehealth for [facility].   The patient was identified using 2 identifiers.  Date:  06/02/2019   ID:  Stephen Booker, DOB [birth year], MRN [id number]  Patient Location: Home Provider Location: Home  PCP:  Alycia Rossetti, MD  Cardiologist:  Kate Sable, MD  Electrophysiologist:  None   Evaluation Performed:  Follow-Up Visit  Chief Complaint:  None  History of Present Illness:    Stephen Booker is a 69 y.o. male with a history of coronary artery disease with three-vessel CABG on Jun 01, 2012 and dyslipidemia.  There was no ischemia by nuclear stress testing on 02/25/2017. Duke treadmill score was low. LVEF 64%.  He did not tolerate statin therapy and I prescribed Repatha.  The patient denies any symptoms of chest pain, palpitations, shortness of breath, lightheadedness, dizziness, leg swelling, orthopnea, PND, and syncope.  Today is the 7 year anniversary since his CABG, 7 years since he last drank alcohol, and 40 years since he last smoked cigarettes.  His urologist put him on tadalafil daily to help with urination.  Social history: Born in [place], [place]. 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 He is divorced and has 2 daughters. He first moved to [place] and lived with his sister and thenmoved to [place] in late December 2017.He has a brother-in-law who is an Magazine features editor in [place], [place].  Past Medical History:  Diagnosis Date  . Arthritis   . Back pain   . Coronary artery disease    a. s/p CABG in 05/2012 b. NST in 02/2017 showing no significant ischemia  . Diverticulitis   . Hyperlipidemia   . Hypertension   . S/P triple vessel bypass   . Thyroid disease    Past Surgical History:  Procedure Laterality Date  . CORONARY ARTERY BYPASS GRAFT    . INGUINAL HERNIA REPAIR    . KNEE ARTHROSCOPY Right    x2  . KNEE ARTHROSCOPY Right    has had 2 right knee arthroscopies one when he was in his 72s the other in his 69s  . UMBILICAL HERNIA REPAIR       Current Meds  Medication Sig  . amLODipine (NORVASC) 5 MG tablet Take 1 tablet (5 mg total) by mouth daily.  Marland Kitchen aspirin EC 81 MG tablet Take 81 mg by mouth daily.  . Evolocumab (REPATHA SURECLICK) [id number] MG/ML SOAJ Inject 140 mg into the skin every 14 (fourteen) days.  Marland Kitchen levothyroxine (SYNTHROID) 50 MCG tablet TAKE 1 TABLET(50 MCG) BY MOUTH DAILY BEFORE BREAKFAST  . metoprolol succinate (TOPROL-XL) 50 MG 24 hr tablet Take 1 tablet (50 mg total) by mouth daily. Take with or immediately following a meal.  . nitroGLYCERIN (NITROSTAT) 0.4 MG SL tablet  Place 1 tablet (0.4 mg total) under the tongue every 5 (five) minutes as needed for chest pain.  Marland Kitchen oxyCODONE-acetaminophen (PERCOCET/ROXICET) 5-325 MG tablet Take 1 tablet by mouth every 8 (eight) hours as needed.  . sildenafil (REVATIO) 20 MG tablet Take 1-5 tablets by mouth as needed.  . tadalafil (CIALIS) 5 MG tablet Take 1 tablet (5 mg total) by mouth daily as needed for erectile dysfunction.     Allergies:   Statins and Penicillins   Social History   Tobacco Use  . Smoking status: Former Smoker    Quit date: 01/06/1985     Years since quitting: 34.4  . Smokeless tobacco: Never Used  Substance Use Topics  . Alcohol use: No  . Drug use: No     Family Hx: The patient's family history includes Arthritis in his mother and sister; COPD in his brother; Cancer in his brother, father, and maternal aunt; Drug abuse in his sister; Early death in his brother and sister; Heart disease in his mother.  ROS:   Please see the history of present illness.     All other systems reviewed and are negative.   Prior CV studies:   The following studies were reviewed today:  Reviewed above  Labs/Other Tests and Data Reviewed:    EKG:  No ECG reviewed.  Recent Labs: 06/11/2018: ALT 13; BUN 14; Creat 0.88; Hemoglobin 14.2; Platelets 347; Potassium 4.5; Sodium 142; TSH 1.02   Recent Lipid Panel Lab Results  Component Value Date/Time   CHOL 136 06/11/2018 08:06 AM   CHOL 257 (H) 12/17/2017 02:26 PM   TRIG 148 06/11/2018 08:06 AM   HDL 55 06/11/2018 08:06 AM   HDL 53 12/17/2017 02:26 PM   CHOLHDL 2.5 06/11/2018 08:06 AM   LDLCALC 58 06/11/2018 08:06 AM    Wt Readings from Last 3 Encounters:  06/02/19 178 lb (80.7 kg)  04/29/19 170 lb (77.1 kg)  06/07/18 170 lb 8 oz (77.3 kg)     Objective:    Vital Signs:  BP 140/80   Ht 5\' 7"  (1.702 m)   Wt 178 lb (80.7 kg)   BMI 27.88 kg/m   Pulse: 68 bpm  VITAL SIGNS:  reviewed  ASSESSMENT & PLAN:    1. CAD with history of 3-vessel CABG in 05/2012:Symptomatically stable. Left ventricular systolic function is normal, LVEF 65%.Continue aspirin, Repatha, andToprol-XL. I warned him about not using nitro concomitantly with tadalafil (prescribed by urology).  2. Mixed dyslipidemia:Lipids reviewed above. Continue Repatha. Goal LDL<70.  3. Hypothyroidism: Remains on Synthroid.  4. Hypertension: Mildly elevated on present therapy which includes metoprolol succinate and amlodipine. No changes for now.   COVID-19 Education: The signs and symptoms of COVID-19 were  discussed with the patient and how to seek care for testing (follow up with PCP or arrange E-visit).  The importance of social distancing was discussed today.  Time:   Today, I have spent 20 minutes with the patient with telehealth technology discussing the above problems.     Medication Adjustments/Labs and Tests Ordered: Current medicines are reviewed at length with the patient today.  Concerns regarding medicines are outlined above.   Tests Ordered: No orders of the defined types were placed in this encounter.   Medication Changes: No orders of the defined types were placed in this encounter.   Follow Up:  In Person in 1 year(s)  Signed, Kate Sable, MD  06/02/2019 9:39 AM    [place]

## 2019-06-02 NOTE — Patient Instructions (Signed)
Medication Instructions:  Your physician recommends that you continue on your current medications as directed. Please refer to the Current Medication list given to you today.  *If you need a refill on your cardiac medications before your next appointment, please call your pharmacy*   Lab Work: NONE  If you have labs (blood work) drawn today and your tests are completely normal, you will receive your results only by: . MyChart Message (if you have MyChart) OR . A paper copy in the mail If you have any lab test that is abnormal or we need to change your treatment, we will call you to review the results.   Testing/Procedures: NONE   Follow-Up: At [facility], you and your health needs are our priority.  As part of our continuing mission to provide you with exceptional heart care, we have created designated Provider Care Teams.  These Care Teams include your primary Cardiologist (physician) and Advanced Practice Providers (APPs -  Physician Assistants and Nurse Practitioners) who all work together to provide you with the care you need, when you need it.  We recommend signing up for the patient portal called "MyChart".  Sign up information is provided on this After Visit Summary.  MyChart is used to connect with patients for Virtual Visits (Telemedicine).  Patients are able to view lab/test results, encounter notes, upcoming appointments, etc.  Non-urgent messages can be sent to your provider as well.   To learn more about what you can do with MyChart, go to [email or web address].    Your next appointment:   1 year(s)  The format for your next appointment:   In Person  Provider:   Suresh Koneswaran, MD   Other Instructions Thank you for choosing [facility]!    

## 2019-06-13 ENCOUNTER — Encounter (HOSPITAL_COMMUNITY): Payer: Self-pay | Admitting: *Deleted

## 2019-06-13 ENCOUNTER — Other Ambulatory Visit: Payer: Self-pay

## 2019-06-13 ENCOUNTER — Emergency Department (HOSPITAL_COMMUNITY)
Admission: EM | Admit: 2019-06-13 | Discharge: 2019-06-13 | Disposition: A | Discharge status: Home / Self Care | Payer: Medicare HMO | Attending: Emergency Medicine | Admitting: Emergency Medicine

## 2019-06-13 DIAGNOSIS — Y999 Unspecified external cause status: Secondary | ICD-10-CM | POA: Diagnosis not present

## 2019-06-13 DIAGNOSIS — W57XXXA Bitten or stung by nonvenomous insect and other nonvenomous arthropods, initial encounter: Secondary | ICD-10-CM | POA: Insufficient documentation

## 2019-06-13 DIAGNOSIS — R6889 Other general symptoms and signs: Secondary | ICD-10-CM | POA: Insufficient documentation

## 2019-06-13 DIAGNOSIS — Y939 Activity, unspecified: Secondary | ICD-10-CM | POA: Insufficient documentation

## 2019-06-13 DIAGNOSIS — Y929 Unspecified place or not applicable: Secondary | ICD-10-CM | POA: Diagnosis not present

## 2019-06-13 NOTE — ED Triage Notes (Signed)
Pt found tick that still attached to left side with redness around site.  Pt has not removed the tick.

## 2019-06-14 ENCOUNTER — Encounter: Payer: Self-pay | Admitting: Family Medicine

## 2019-06-14 ENCOUNTER — Ambulatory Visit: Payer: Medicare HMO | Admitting: Family Medicine

## 2019-06-14 VITALS — BP 132/68 | HR 80 | Temp 98.1°F | Resp 14 | Ht 67.0 in | Wt 183.0 lb

## 2019-06-14 DIAGNOSIS — S30861A Insect bite (nonvenomous) of abdominal wall, initial encounter: Secondary | ICD-10-CM | POA: Diagnosis not present

## 2019-06-14 DIAGNOSIS — Z9189 Other specified personal risk factors, not elsewhere classified: Secondary | ICD-10-CM

## 2019-06-14 DIAGNOSIS — W57XXXA Bitten or stung by nonvenomous insect and other nonvenomous arthropods, initial encounter: Secondary | ICD-10-CM | POA: Diagnosis not present

## 2019-06-14 DIAGNOSIS — I10 Essential (primary) hypertension: Secondary | ICD-10-CM | POA: Diagnosis not present

## 2019-06-14 LAB — CBC WITH DIFFERENTIAL/PLATELET
Absolute Monocytes: 897 cells/uL (ref 200–950)
Basophils Absolute: 49 cells/uL (ref 0–200)
Basophils Relative: 0.8 %
Eosinophils Absolute: 360 cells/uL (ref 15–500)
Eosinophils Relative: 5.9 %
HCT: 43.4 % (ref 38.5–50.0)
Hemoglobin: 15 g/dL (ref 13.2–17.1)
Lymphs Abs: 2007 cells/uL (ref 850–3900)
MCH: 29.5 pg (ref 27.0–33.0)
MCHC: 34.6 g/dL (ref 32.0–36.0)
MCV: 85.4 fL (ref 80.0–100.0)
MPV: 8.9 fL (ref 7.5–12.5)
Monocytes Relative: 14.7 %
Neutro Abs: 2788 cells/uL (ref 1500–7800)
Neutrophils Relative %: 45.7 %
Platelets: 302 10*3/uL (ref 140–400)
RBC: 5.08 10*6/uL (ref 4.20–5.80)
RDW: 12.6 % (ref 11.0–15.0)
Total Lymphocyte: 32.9 %
WBC: 6.1 10*3/uL (ref 3.8–10.8)

## 2019-06-14 MED ORDER — DOXYCYCLINE HYCLATE 100 MG PO TABS: 100.0000 mg | ORAL_TABLET | Freq: Two times a day (BID) | ORAL | 0 refills | Status: DC

## 2019-06-14 NOTE — Progress Notes (Signed)
   Subjective:    Patient ID: Stephen Booker, male    DOB: 1950-06-15, 69 y.o.   MRN: [id number]  Patient presents for Tick Bite (x1 day- embedded in L side- has slight ring around bite)    Tick bite left lower side . He has been outside in his yard. He pulled the tick off yesterday, was able to smother with vaseline, unknown how long it has been present. He noticed a red ring around it. No fever, no chills, no N/V, no new joint pain or myalgias  Review Of Systems:  GEN- denies fatigue, fever, weight loss,weakness, recent illness HEENT- denies eye drainage, change in vision, nasal discharge, CVS- denies chest pain, palpitations RESP- denies SOB, cough, wheeze ABD- denies N/V, change in stools, abd pain GU- denies dysuria, hematuria, dribbling, incontinence MSK- denies joint pain, muscle aches, injury Neuro- denies headache, dizziness, syncope, seizure activity       Objective:    BP 132/68   Pulse 80   Temp 98.1 F (36.7 C) (Temporal)   Resp 14   Ht 5\' 7"  (1.702 m)   Wt 183 lb (83 kg)   SpO2 96%   BMI 28.66 kg/m  GEN- NAD, alert and oriented x3 HEENT- PERRL, EOMI, non injected sclera, pink conjunctiva, MMM, oropharynx clear Neck- Supple, no LAD CVS- RRR, no murmur RESP-CTAB ABD-NABS,soft,NT,ND Skin- LEft abd  wall - small dize size area of  erythema with central clearing  EXT- No edema Pulses- Radial2+        Assessment & Plan:      Problem List Items Addressed This Visit    None    Visit Diagnoses    Tick bite of abdominal wall, initial encounter    -  Primary   start doxycycline 100mg  BID x 10 days , discussed lyme symptoms    Relevant Orders   CBC with Differential/Platelet (Completed)   At increased risk of exposure to COVID-19 virus       Pt asked for antibody testing, does not want covid vaccine , discussed even if positive exposure where he was asymptomatic we still recommend vaccination   Relevant Orders   SARS CoV2 Serology(COVID19)  AB(IgG,IgM),Immunoassay      Note: This dictation was prepared with Dragon dictation along with smaller phrase technology. Any transcriptional errors that result from this process are unintentional.

## 2019-06-14 NOTE — Patient Instructions (Addendum)
Schedule wellness visit  Take the doxycycline

## 2019-06-15 LAB — SARS COV-2 SEROLOGY(COVID-19)AB(IGG,IGM),IMMUNOASSAY
SARS CoV-2 AB IgG: NEGATIVE
SARS CoV-2 IgM: NEGATIVE

## 2019-06-20 ENCOUNTER — Telehealth: Payer: Self-pay | Admitting: *Deleted

## 2019-06-20 NOTE — Telephone Encounter (Signed)
Call placed to patient and patient made aware.  

## 2019-06-20 NOTE — Telephone Encounter (Signed)
Yes, he can complete the antibiotics Then wait 1 week, then get vaccine

## 2019-06-20 NOTE — Telephone Encounter (Signed)
Received call from patient.   Inquired as to if he should wait to complete tx for Lyme Disease prior to obtaining Covid vaccine.   MD please advise.

## 2019-06-23 ENCOUNTER — Other Ambulatory Visit: Payer: Self-pay | Admitting: *Deleted

## 2019-06-23 NOTE — Telephone Encounter (Signed)
Ok to refill??  Last office visit 06/14/2019.  Last refill 05/23/2019.

## 2019-06-24 MED ORDER — OXYCODONE-ACETAMINOPHEN 5-325 MG PO TABS: 1.0000 | ORAL_TABLET | Freq: Three times a day (TID) | ORAL | 0 refills | Status: DC | PRN

## 2019-07-19 ENCOUNTER — Other Ambulatory Visit: Payer: Self-pay | Admitting: *Deleted

## 2019-07-19 MED ORDER — OXYCODONE-ACETAMINOPHEN 5-325 MG PO TABS: 1.0000 | ORAL_TABLET | Freq: Three times a day (TID) | ORAL | 0 refills | Status: DC | PRN

## 2019-07-19 NOTE — Telephone Encounter (Signed)
Received call from patient.   Requested refill on Oxycodone/APAP.   Ok to refill??  Last office visit 06/14/2019.  Last refill 06/24/2019.  Of note, patient requested printed prescription as he will be traveling this weekend and is unsure what state he will be in to refill prescription.

## 2019-07-22 ENCOUNTER — Other Ambulatory Visit: Payer: Self-pay | Admitting: *Deleted

## 2019-07-22 MED ORDER — OXYCODONE-ACETAMINOPHEN 5-325 MG PO TABS: 1.0000 | ORAL_TABLET | Freq: Three times a day (TID) | ORAL | 0 refills | Status: DC | PRN

## 2019-07-22 NOTE — Progress Notes (Signed)
Previous script pt did not pick up Front desk unable to locate Printed again

## 2019-08-07 ENCOUNTER — Other Ambulatory Visit: Payer: Self-pay | Admitting: Student

## 2019-08-08 ENCOUNTER — Other Ambulatory Visit: Payer: Self-pay | Admitting: Family Medicine

## 2019-08-24 ENCOUNTER — Other Ambulatory Visit: Payer: Self-pay | Admitting: *Deleted

## 2019-08-24 MED ORDER — OXYCODONE-ACETAMINOPHEN 5-325 MG PO TABS: 1.0000 | ORAL_TABLET | Freq: Three times a day (TID) | ORAL | 0 refills | Status: DC | PRN

## 2019-08-24 NOTE — Telephone Encounter (Signed)
Received call from patient.   Reports that he is visiting his sister in [place] and needs refill on Oxycodone/APAP sent to [facility]- [address], [place] [address].  Ok to refill??  Last office visit 06/14/2019.  Last refill 07/22/2019.

## 2019-09-08 ENCOUNTER — Telehealth: Payer: Self-pay | Admitting: Family Medicine

## 2019-09-08 NOTE — Progress Notes (Signed)
  Chronic Care Management   Note  09/08/2019 Name: Stephen Booker MRN: [id number] DOB: 02-12-1950  [name] is a 69 y.o. year old male who is a primary care patient of Stacy, Modena Nunnery, MD. I reached out to Pervis Hocking by phone today in response to a referral sent by [name] PCP, Buelah Manis, Modena Nunnery, MD.   Mr. Bridge was given information about Chronic Care Management services today including:  1. CCM service includes personalized support from designated clinical staff supervised by his physician, including individualized plan of care and coordination with other care providers 2. 24/7 contact phone numbers for assistance for urgent and routine care needs. 3. Service will only be billed when office clinical staff spend 20 minutes or more in a month to coordinate care. 4. Only one practitioner may furnish and bill the service in a calendar month. 5. The patient may stop CCM services at any time (effective at the end of the month) by phone call to the office staff.   Patient agreed to services and verbal consent obtained.   Follow up plan:   Carley Perdue [facility] Scheduler

## 2019-09-23 ENCOUNTER — Other Ambulatory Visit: Payer: Self-pay

## 2019-09-23 ENCOUNTER — Other Ambulatory Visit: Payer: Self-pay | Admitting: Student

## 2019-09-23 MED ORDER — REPATHA SURECLICK 140 MG/ML ~~LOC~~ SOAJ: 140.0000 mg | SUBCUTANEOUS | 11 refills | Status: DC

## 2019-09-23 NOTE — Telephone Encounter (Signed)
refilled repatha

## 2019-09-23 NOTE — Telephone Encounter (Signed)
I spoke with pharmacy, they claim they told the patient this am that his Repatha will be in on Monday.Left message to patient

## 2019-09-23 NOTE — Telephone Encounter (Signed)
Pt requesting repatha refill at [facility]     Thanks renee

## 2019-09-26 ENCOUNTER — Other Ambulatory Visit: Payer: Self-pay | Admitting: *Deleted

## 2019-09-26 MED ORDER — OXYCODONE-ACETAMINOPHEN 5-325 MG PO TABS: 1.0000 | ORAL_TABLET | Freq: Three times a day (TID) | ORAL | 0 refills | Status: DC | PRN

## 2019-09-26 NOTE — Telephone Encounter (Signed)
Received call from patient.   Requested refill on Oxycodone/APAP.   Ok to refill??  Last office visit 09/08/2019.  Last refill 08/24/2019.

## 2019-10-19 ENCOUNTER — Ambulatory Visit: Payer: Medicare HMO

## 2019-10-19 ENCOUNTER — Other Ambulatory Visit: Payer: Self-pay | Admitting: *Deleted

## 2019-10-19 DIAGNOSIS — E039 Hypothyroidism, unspecified: Secondary | ICD-10-CM

## 2019-10-19 DIAGNOSIS — I10 Essential (primary) hypertension: Secondary | ICD-10-CM

## 2019-10-19 DIAGNOSIS — I25708 Atherosclerosis of coronary artery bypass graft(s), unspecified, with other forms of angina pectoris: Secondary | ICD-10-CM

## 2019-10-19 DIAGNOSIS — K5790 Diverticulosis of intestine, part unspecified, without perforation or abscess without bleeding: Secondary | ICD-10-CM

## 2019-10-19 DIAGNOSIS — E782 Mixed hyperlipidemia: Secondary | ICD-10-CM

## 2019-10-20 ENCOUNTER — Telehealth: Payer: Self-pay | Admitting: Family Medicine

## 2019-10-20 NOTE — Progress Notes (Signed)
  Chronic Care Management   Outreach Note  10/20/2019 Name: Stephen Booker MRN: [id number] DOB: 23-Sep-1950  Referred by: Alycia Rossetti, MD Reason for referral : No chief complaint on file.   An unsuccessful telephone outreach was attempted today. The patient was referred to the pharmacist for assistance with care management and care coordination.   Follow Up Plan:   Carley Perdue [facility] Scheduler

## 2019-10-21 ENCOUNTER — Telehealth: Payer: Self-pay | Admitting: Family Medicine

## 2019-10-21 NOTE — Progress Notes (Signed)
°  Chronic Care Management   Outreach Note  10/21/2019 Name: Stephen Booker MRN: [id number] DOB: 06-Jan-1951  Referred by: Alycia Rossetti, MD Reason for referral : No chief complaint on file.   A second unsuccessful telephone outreach was attempted today. The patient was referred to pharmacist for assistance with care management and care coordination.  Follow Up Plan:   Carley Perdue [facility] Scheduler

## 2019-10-24 ENCOUNTER — Other Ambulatory Visit: Payer: Self-pay | Admitting: *Deleted

## 2019-10-24 ENCOUNTER — Telehealth: Payer: Self-pay | Admitting: Family Medicine

## 2019-10-24 MED ORDER — OXYCODONE-ACETAMINOPHEN 5-325 MG PO TABS: 1.0000 | ORAL_TABLET | Freq: Three times a day (TID) | ORAL | 0 refills | Status: DC | PRN

## 2019-10-24 NOTE — Telephone Encounter (Signed)
Received call from patient.   Requested refill on Oxycodone/APAP.  Ok to refill??  Last office visit 10/18/201.  Last refill 09/26/2019.

## 2019-10-24 NOTE — Progress Notes (Signed)
  Chronic Care Management   Outreach Note  10/24/2019 Name: Stephen Booker MRN: [id number] DOB: [birth year]  Referred by: Alycia Rossetti, MD Reason for referral : No chief complaint on file.   Third unsuccessful telephone outreach was attempted today. The patient was referred to the pharmacist for assistance with care management and care coordination.   Follow Up Plan:   Carley Perdue [facility] Scheduler

## 2019-10-25 ENCOUNTER — Telehealth: Payer: Self-pay | Admitting: Family Medicine

## 2019-10-25 NOTE — Progress Notes (Signed)
  Chronic Care Management   Note  10/25/2019 Name: Stephen Booker MRN: [id number] DOB: [birth year]  [name] is a 69 y.o. year old male who is a primary care patient of [place], Modena Nunnery, MD. I reached out to Pervis Hocking by phone today in response to a referral sent by [name] PCP, Buelah Manis, Modena Nunnery, MD.   [name] was given information about Chronic Care Management services today including:  1. CCM service includes personalized support from designated clinical staff supervised by his physician, including individualized plan of care and coordination with other care providers 2. 24/7 contact phone numbers for assistance for urgent and routine care needs. 3. Service will only be billed when office clinical staff spend 20 minutes or more in a month to coordinate care. 4. Only one practitioner may furnish and bill the service in a calendar month. 5. The patient may stop CCM services at any time (effective at the end of the month) by phone call to the office staff.   Patient agreed to services and verbal consent obtained.   Follow up plan:   Carley Perdue [facility] Scheduler

## 2019-11-04 ENCOUNTER — Other Ambulatory Visit: Payer: Self-pay

## 2019-11-04 MED ORDER — METOPROLOL SUCCINATE ER 50 MG PO TB24
50.0000 mg | ORAL_TABLET | Freq: Every day | ORAL | 3 refills | Status: DC
Start: 2019-11-04 — End: 2020-10-22

## 2019-11-04 NOTE — Telephone Encounter (Signed)
refilled toprol 50 mg #90 to [facility]

## 2019-11-07 ENCOUNTER — Other Ambulatory Visit: Payer: Self-pay | Admitting: Family Medicine

## 2019-11-08 ENCOUNTER — Telehealth: Payer: Self-pay | Admitting: Pharmacist

## 2019-11-08 NOTE — Chronic Care Management (AMB) (Signed)
Chronic Care Management Pharmacy  Name: Stephen Booker  MRN: [id number] DOB: 10/13/1950    Chief Complaint/ HPI  Stephen Booker,  69 y.o. , male presents for their Initial CCM visit with the clinical pharmacist In office.  PCP : Alycia Rossetti, MD  Their chronic conditions include: CAD, HTN, Hypothyroidism, Osteoarthritis, HLD.  Office Visits: 06/14/2019 [facility]) - tick bite embedded in L side, it has red ring around it but he denies fever, chills, or joint pain.  He was given doxycycline $RemoveBeforeDEI'100mg'LmIoWXIZVKTObLUd$  bid for 10 days.  Consult Visit: 04/29/2019 [facility]) - erectile dysfunction and BPH.  He prefers to take sildenafil over the trimix.  Medications: Outpatient Encounter Medications as of 11/09/2019  Medication Sig  . amLODipine (NORVASC) 5 MG tablet TAKE 1 TABLET BY MOUTH EVERY DAY  . aspirin EC 81 MG tablet Take 81 mg by mouth daily.  Marland Kitchen doxycycline (VIBRA-TABS) 100 MG tablet Take 1 tablet (100 mg total) by mouth 2 (two) times daily. (Patient not taking: Reported on 11/09/2019)  . Evolocumab (REPATHA SURECLICK) 409 MG/ML SOAJ Inject 140 mg into the skin every 14 (fourteen) days.  Marland Kitchen levothyroxine (SYNTHROID) 50 MCG tablet TAKE 1 TABLET(50 MCG) BY MOUTH DAILY BEFORE BREAKFAST  . metoprolol succinate (TOPROL-XL) 50 MG 24 hr tablet Take 1 tablet (50 mg total) by mouth daily. Take with or immediately following a meal.  . nitroGLYCERIN (NITROSTAT) 0.4 MG SL tablet Place 1 tablet (0.4 mg total) under the tongue every 5 (five) minutes as needed for chest pain. (Patient not taking: Reported on 06/14/2019)  . oxyCODONE-acetaminophen (PERCOCET/ROXICET) 5-325 MG tablet Take 1 tablet by mouth every 8 (eight) hours as needed.  . Papav-Phentolamine-Alprostadil 12-1-0.01 MG/ML SOLN by Intracavernosal route.  . sildenafil (REVATIO) 20 MG tablet Take 1-5 tablets by mouth as needed.  . tadalafil (CIALIS) 5 MG tablet Take 1 tablet (5 mg total) by mouth daily as needed for erectile dysfunction. (Patient not taking:  Reported on 11/09/2019)   No facility-administered encounter medications on file as of 11/09/2019.     Current Diagnosis/Assessment:   Emergency planning/management officer Strain: Low Risk   . Difficulty of Paying Living Expenses: Not very hard    Goals Addressed            This Visit's Progress   . Pharmacy Care Plan:       CARE PLAN ENTRY (see longitudinal plan of care for additional care plan information)  Current Barriers:  . Chronic Disease Management support, education, and care coordination needs related to Hypertension and Hyperlipidemia   Hypertension BP Readings from Last 3 Encounters:  06/14/19 132/68  06/02/19 140/80  04/29/19 132/77   . Pharmacist Clinical Goal(s): o Over the next 180 days, patient will work with PharmD and providers to maintain BP goal <130/80 . Current regimen:  o Amlodipine $RemoveBefo'5mg'LXtDXPpcNed$  daily o Metoprolol XL $RemoveBefor'50mg'HDJuhllKyhYw$  . Interventions: o Reviewed home BP monitoring o Recommended taking Toprol XL in the evening to try and help with daytime sleepiness . Patient self care activities - Over the next 180 days, patient will: o Check BP daily, document, and provide at future appointments o Ensure daily salt intake < 2300 mg/day o Trial of Toprol XL in the evening for daytime fatigue  Hyperlipidemia Lab Results  Component Value Date/Time   LDLCALC 58 06/11/2018 08:06 AM   . Pharmacist Clinical Goal(s): o Over the next 180 days, patient will work with PharmD and providers to maintain LDL goal < 70 . Current regimen:  o Repatha $RemoveB'140mg'ElnmQOTu$ /mL .  Interventions: o Reviewed most recent lipid panel o Discussed goals of treatment o Discussed available patient assistance programs . Patient self care activities - Over the next 180 days, patient will: o Continue to take medication as directed  o Contact providers if cost becomes an issue    Initial goal documentation        Hypertension   BP goal is:  <130/80  Office blood pressures are  BP Readings from Last 3  Encounters:  06/14/19 132/68  06/02/19 140/80  04/29/19 132/77   Patient checks BP at home daily Patient home BP readings are ranging: 120-70s per patient at home today  Patient has failed these meds in the past: none noted Patient is currently controlled on the following medications:  . Amlodipine $RemoveBefo'5mg'vZPlxIdmQrl$  daily . Metoprolol XL $RemoveBefor'50mg'GkNJsbUFOxEi$  daily  We discussed   He denies dizziness, headaches, swelling  Reports 100% compliance with medication  Does report some sleepiness and fatigue, encouraged him to take Toprol XL at bedtime to help with som of this daytime fatigue   Plan  Continue current medications     Hyperlipidemia   LDL goal < 70  Last lipids Lab Results  Component Value Date   CHOL 136 06/11/2018   HDL 55 06/11/2018   LDLCALC 58 06/11/2018   TRIG 148 06/11/2018   CHOLHDL 2.5 06/11/2018   Hepatic Function Latest Ref Rng & Units 06/11/2018 12/18/2017 12/17/2017  Total Protein 6.1 - 8.1 g/dL 7.0 7.3 7.4  Albumin 3.6 - 4.8 g/dL - - 5.0(H)  AST 10 - 35 U/L $Remo'16 18 19  'kCGOD$ ALT 9 - 46 U/L $Remo'13 14 16  'TcHVV$ Alk Phosphatase 39 - 117 IU/L - - 68  Total Bilirubin 0.2 - 1.2 mg/dL 0.4 0.5 0.4  Bilirubin, Direct 0.00 - 0.40 mg/dL - - 0.11     The 10-year ASCVD risk score Mikey Bussing DC Jr., et al., 2013) is: 17.5%   Values used to calculate the score:     Age: 43 years     Sex: Male     Is Non-Hispanic African American: No     Diabetic: No     Tobacco smoker: No     Systolic Blood Pressure: 497 mmHg     Is BP treated: Yes     HDL Cholesterol: 55 mg/dL     Total Cholesterol: 136 mg/dL   Patient has failed these meds in past: statins (muscle aches)  Patient is currently controlled on the following medications:  . Repatha $RemoveB'140mg'ARsEVXyC$ /ml  We discussed:    Reports cost is manageable at the moment, he is to contact us if this changes in the future  Cholesterol is very well controlled  We discussed importance of controlling cholesterol with his cardiac history  Plan  Continue current  medications Hypothyroidism   Lab Results  Component Value Date/Time   TSH 1.02 06/11/2018 08:06 AM   TSH 0.56 12/18/2017 02:56 PM   FREET4 1.1 10/10/2016 02:55 PM   FREET4 1.1 03/19/2016 08:22 AM    Patient has failed these meds in past: none noted Patient is currently controlled on the following medications:  . Levothyroxine 66mcg daily  We discussed:    Take medication 30-60 minutes before food/drink or other medications, he reports adherence to this  TSH is WNL, denies symptoms  Plan  Continue current medications   CAD   Patient has failed these meds in past: none noted Patient is currently controlled on the following medications:  . ASA $Rem'81mg'HtPK$   We discussed:  Controlling risk factors  Denies abnormal bleeding or bruising  Plan  Continue current medications Osteoarthritis   Patient has failed these meds in past: none noted Patient is currently controlled on the following medications:  . Oxycodone/APAP 5-325mg   We discussed:    He reports that pain is unchanged from previously  His medication controls it the best he feels possible  Denies taking any other OTC medications for pain  Plan  Continue current medications   Vaccines   Reviewed and discussed patient's vaccination history.    Immunization History  Administered Date(s) Administered  . Influenza, High Dose Seasonal PF 12/15/2017  . Influenza,inj,Quad PF,6+ Mos 10/10/2016  . Zoster Recombinat (Shingrix) 09/03/2016, 12/13/2016    Plan  Recommended patient receive flu vaccine in  office.  Medication Management   . Miscellaneous medications:  o Sildenafil 20mg  . OTC's:  o ASA 81mg  . Patient currently uses Location manager.   . Patient reports using no specific method to organize medications and promote adherence. . Patient denies missed doses of medication.   Beverly Milch, PharmD Clinical Pharmacist [place] [phone]

## 2019-11-08 NOTE — Progress Notes (Signed)
Chronic Care Management Pharmacy Assistant   Name: Stephen Booker  MRN: [id number] DOB: 06-21-1950  Reason for Encounter: Initial Questions   PCP : Alycia Rossetti, MD  Allergies:   Allergies  Allergen Reactions  . Statins     Myalgia   . Penicillins Rash    Medications: Outpatient Encounter Medications as of 11/08/2019  Medication Sig  . amLODipine (NORVASC) 5 MG tablet TAKE 1 TABLET BY MOUTH EVERY DAY  . aspirin EC 81 MG tablet Take 81 mg by mouth daily.  Marland Kitchen doxycycline (VIBRA-TABS) 100 MG tablet Take 1 tablet (100 mg total) by mouth 2 (two) times daily.  . Evolocumab (REPATHA SURECLICK) 829 MG/ML SOAJ Inject 140 mg into the skin every 14 (fourteen) days.  Marland Kitchen levothyroxine (SYNTHROID) 50 MCG tablet TAKE 1 TABLET(50 MCG) BY MOUTH DAILY BEFORE BREAKFAST  . metoprolol succinate (TOPROL-XL) 50 MG 24 hr tablet Take 1 tablet (50 mg total) by mouth daily. Take with or immediately following a meal.  . nitroGLYCERIN (NITROSTAT) 0.4 MG SL tablet Place 1 tablet (0.4 mg total) under the tongue every 5 (five) minutes as needed for chest pain. (Patient not taking: Reported on 06/14/2019)  . oxyCODONE-acetaminophen (PERCOCET/ROXICET) 5-325 MG tablet Take 1 tablet by mouth every 8 (eight) hours as needed.  . Papav-Phentolamine-Alprostadil 12-1-0.01 MG/ML SOLN by Intracavernosal route.  . sildenafil (REVATIO) 20 MG tablet Take 1-5 tablets by mouth as needed.  . tadalafil (CIALIS) 5 MG tablet Take 1 tablet (5 mg total) by mouth daily as needed for erectile dysfunction.   No facility-administered encounter medications on file as of 11/08/2019.    Current Diagnosis: Patient Active Problem List   Diagnosis Date Noted  . Hypertension 10/10/2016  . ED (erectile dysfunction) 06/10/2016  . Hyperlipidemia 06/09/2016  . Chronic insomnia 06/09/2016  . Anxiety with depression 06/09/2016  . CAD (coronary artery disease) 03/07/2016  . Diverticulosis 03/07/2016  . Chronic pain syndrome 03/07/2016  .  Lumbar spondylosis 03/07/2016  . Hypothyroidism 03/07/2016  . OA (osteoarthritis) of knee 03/07/2016  . Arthritis of hand 03/07/2016    Goals Addressed   None     Have you seen any other providers since your last visit? No   Any changes in your medications or health? Urologist prescribed tadalafil (CIALIS) 5 MG tablet patient stated he has stopped that medication about a month ago. Stated it was not helpful to him after taking for a few months   Any side effects from any medications? Not that he is aware of   Do you have an symptoms or problems not managed by your medications? No   Any concerns about your health right now? Patient reports he feels more fatigued.   Has your provider asked that you check blood pressure, blood sugar, or follow special diet at home? No   Do you get any type of exercise on a regular basis? Patient reports being in a lot of back pain and unable to do any exercise. Has degenerative spine   Can you think of a goal you would like to reach for your health? Pain management   Do you have any problems getting your medications? Patient states he gets medication from [facility] and [place] depending on the cost of the medication. Reviewed the benefits of receiving medications with [facility] and having his CCM care team hep manage his medication dispensing. Patient stated interest if it would be cost efficient to him.   Is there anything that you would like to discuss  during the appointment? Would like to discuss cardiac medications  Reminded the patient to please bring medications and supplements to appointment on Wednesday at 3:30pm on the office.   Follow-Up:  Pharmacist Review   Fanny Skates, [place] Pharmacist Assistant [phone]

## 2019-11-09 ENCOUNTER — Ambulatory Visit: Payer: Medicare HMO | Admitting: Pharmacist

## 2019-11-09 ENCOUNTER — Other Ambulatory Visit: Payer: Self-pay

## 2019-11-09 DIAGNOSIS — I10 Essential (primary) hypertension: Secondary | ICD-10-CM

## 2019-11-09 DIAGNOSIS — E782 Mixed hyperlipidemia: Secondary | ICD-10-CM

## 2019-11-09 NOTE — Patient Instructions (Addendum)
Visit Information Thank you for meeting with me today!  I look forward to working with you to help you meet all of your healthcare goals and answer any questions you may have.  Feel free to contact me anytime!  Goals Addressed            This Visit's Progress   . Pharmacy Care Plan:       CARE PLAN ENTRY (see longitudinal plan of care for additional care plan information)  Current Barriers:  . Chronic Disease Management support, education, and care coordination needs related to Hypertension and Hyperlipidemia   Hypertension BP Readings from Last 3 Encounters:  06/14/19 132/68  06/02/19 140/80  04/29/19 132/77   . Pharmacist Clinical Goal(s): o Over the next 180 days, patient will work with PharmD and providers to maintain BP goal <130/80 . Current regimen:  o Amlodipine 5mg  daily o Metoprolol XL 50mg  . Interventions: o Reviewed home BP monitoring o Recommended taking Toprol XL in the evening to try and help with daytime sleepiness . Patient self care activities - Over the next 180 days, patient will: o Check BP daily, document, and provide at future appointments o Ensure daily salt intake < 2300 mg/day o Trial of Toprol XL in the evening for daytime fatigue  Hyperlipidemia Lab Results  Component Value Date/Time   LDLCALC 58 06/11/2018 08:06 AM   . Pharmacist Clinical Goal(s): o Over the next 180 days, patient will work with PharmD and providers to maintain LDL goal < 70 . Current regimen:  o Repatha 140mg /mL . Interventions: o Reviewed most recent lipid panel o Discussed goals of treatment o Discussed available patient assistance programs . Patient self care activities - Over the next 180 days, patient will: o Continue to take medication as directed  o Contact providers if cost becomes an issue    Initial goal documentation        Mr. Aguayo was given information about Chronic Care Management services today including:  1. CCM service includes personalized  support from designated clinical staff supervised by his physician, including individualized plan of care and coordination with other care providers 2. 24/7 contact phone numbers for assistance for urgent and routine care needs. 3. Standard insurance, coinsurance, copays and deductibles apply for chronic care management only during months in which we provide at least 20 minutes of these services. Most insurances cover these services at 100%, however patients may be responsible for any copay, coinsurance and/or deductible if applicable. This service may help you avoid the need for more expensive face-to-face services. 4. Only one practitioner may furnish and bill the service in a calendar month. 5. The patient may stop CCM services at any time (effective at the end of the month) by phone call to the office staff.  Patient agreed to services and verbal consent obtained.   The patient verbalized understanding of instructions provided today and agreed to receive a mailed copy of patient instruction and/or educational materials. Telephone follow up appointment with pharmacy team member scheduled for: 6 months Beverly Milch, PharmD Clinical Pharmacist [facility] [phone]   Chronic Pain, Adult Chronic pain is a type of pain that lasts or keeps coming back (recurs) for at least six months. You may have chronic headaches, abdominal pain, or body pain. Chronic pain may be related to an illness, such as fibromyalgia or complex regional pain syndrome. Sometimes the cause of chronic pain is not known. Chronic pain can make it hard for you to do  daily activities. If not treated, chronic pain can lead to other health problems, including anxiety and depression. Treatment depends on the cause and severity of your pain. You may need to work with a pain specialist to come up with a treatment plan. The plan may include medicine, counseling, and physical therapy. Many people benefit from a  combination of two or more types of treatment to control their pain. Follow these instructions at home: Lifestyle  Consider keeping a pain diary to share with your health care providers.  Consider talking with a mental health care provider (psychologist) about how to cope with chronic pain.  Consider joining a chronic pain support group.  Try to control or lower your stress levels. Talk to your health care provider about strategies to do this. General instructions   Take over-the-counter and prescription medicines only as told by your health care provider.  Follow your treatment plan as told by your health care provider. This may include: ? Gentle, regular exercise. ? Eating a healthy diet that includes foods such as vegetables, fruits, fish, and lean meats. ? Cognitive or behavioral therapy. ? Working with a Community education officer. ? Meditation or yoga. ? Acupuncture or massage therapy. ? Aroma, color, light, or sound therapy. ? Local electrical stimulation. ? Shots (injections) of numbing or pain-relieving medicines into the spine or the area of pain.  Check your pain level as told by your health care provider. Ask your health care provider if you should use a pain scale.  Learn as much as you can about how to manage your chronic pain. Ask your health care provider if an intensive pain rehabilitation program or a chronic pain specialist would be helpful.  Keep all follow-up visits as told by your health care provider. This is important. Contact a health care provider if:  Your pain gets worse.  You have new pain.  You have trouble sleeping.  You have trouble doing your normal activities.  Your pain is not controlled with treatment.  Your have side effects from pain medicine.  You feel weak. Get help right away if:  You lose feeling or have numbness in your body.  You lose control of bowel or bladder function.  Your pain suddenly gets much worse.  You develop  shaking or chills.  You develop confusion.  You develop chest pain.  You have trouble breathing or shortness of breath.  You pass out.  You have thoughts about hurting yourself or others. This information is not intended to replace advice given to you by your health care provider. Make sure you discuss any questions you have with your health care provider. Document Revised: 12/05/2016 Document Reviewed: 06/12/2015 Elsevier Patient Education  [place].

## 2019-11-21 ENCOUNTER — Other Ambulatory Visit: Payer: Self-pay | Admitting: *Deleted

## 2019-11-21 MED ORDER — OXYCODONE-ACETAMINOPHEN 5-325 MG PO TABS
1.0000 | ORAL_TABLET | Freq: Three times a day (TID) | ORAL | 0 refills | Status: DC | PRN
Start: 2019-11-21 — End: 2019-12-23

## 2019-11-21 NOTE — Telephone Encounter (Signed)
Received call from patient.   Requested refill on Oxycodone/APAP.  Ok to refill??  Last office visit 10/25/2019  Last refill 10/24/2019.

## 2019-11-24 ENCOUNTER — Telehealth: Payer: Self-pay | Admitting: *Deleted

## 2019-11-24 NOTE — Telephone Encounter (Signed)
noted 

## 2019-11-24 NOTE — Telephone Encounter (Signed)
Received call from patient.   Reports that he thinks he is having gout flare. States that joint on R hand and wrist were swollen and painful for a few days. States that pain is much more bearable today and he is treating with his current pain medicine.   Advised that if swelling/ pain is not resolved he will need to be seen. Also gave information on low purine diet.   MD to be made aware.

## 2019-11-29 ENCOUNTER — Ambulatory Visit: Payer: Self-pay | Admitting: Pharmacist

## 2019-11-29 NOTE — Chronic Care Management (AMB) (Signed)
  Chronic Care Management   Follow Up Note   11/29/2019 Name: Stephen Booker MRN: [id number] DOB: [birth year]  Referred by: Stephen Rossetti, MD Reason for referral : No chief complaint on file.   Stephen Booker is a 69 y.o. year old male who is a primary care patient of [place], Modena Nunnery, MD. The CCM team was consulted for assistance with chronic disease management and care coordination needs.    Review of patient status, including review of consultants reports, relevant laboratory and other test results, and collaboration with appropriate care team members and the patient's provider was performed as part of comprehensive patient evaluation and provision of chronic care management services.    SDOH (Social Determinants of Health) assessments performed: No See Care Plan activities for detailed interventions related to [facility])     Outpatient Encounter Medications as of 11/29/2019  Medication Sig  . amLODipine (NORVASC) 5 MG tablet TAKE 1 TABLET BY MOUTH EVERY DAY  . aspirin EC 81 MG tablet Take 81 mg by mouth daily.  Marland Kitchen doxycycline (VIBRA-TABS) 100 MG tablet Take 1 tablet (100 mg total) by mouth 2 (two) times daily. (Patient not taking: Reported on 11/09/2019)  . Evolocumab (REPATHA SURECLICK) 505 MG/ML SOAJ Inject 140 mg into the skin every 14 (fourteen) days.  Marland Kitchen levothyroxine (SYNTHROID) 50 MCG tablet TAKE 1 TABLET(50 MCG) BY MOUTH DAILY BEFORE BREAKFAST  . metoprolol succinate (TOPROL-XL) 50 MG 24 hr tablet Take 1 tablet (50 mg total) by mouth daily. Take with or immediately following a meal.  . nitroGLYCERIN (NITROSTAT) 0.4 MG SL tablet Place 1 tablet (0.4 mg total) under the tongue every 5 (five) minutes as needed for chest pain. (Patient not taking: Reported on 06/14/2019)  . oxyCODONE-acetaminophen (PERCOCET/ROXICET) 5-325 MG tablet Take 1 tablet by mouth every 8 (eight) hours as needed.  . Papav-Phentolamine-Alprostadil 12-1-0.01 MG/ML SOLN by Intracavernosal route.  . sildenafil (REVATIO) 20  MG tablet Take 1-5 tablets by mouth as needed.  . tadalafil (CIALIS) 5 MG tablet Take 1 tablet (5 mg total) by mouth daily as needed for erectile dysfunction. (Patient not taking: Reported on 11/09/2019)   No facility-administered encounter medications on file as of 11/29/2019.     Objective:  Analyze care gaps and adherence data for STAR measures.  Goals Addressed   None     There are no care plans to display for this patient. Care gaps: 2  AWV not performed  No statin theray in CVD  Adherence Information  Does not meet any measures due to no statin therapy in cardiovascular disease.  Plan:   The patient has been provided with contact information for the care management team and has been advised to call with any health related questions or concerns.    Stephen Booker, PharmD Clinical Pharmacist [place] [phone]

## 2019-12-23 ENCOUNTER — Other Ambulatory Visit: Payer: Self-pay | Admitting: *Deleted

## 2019-12-23 NOTE — Telephone Encounter (Signed)
Received call from patient.   Requested refill on Oxycodone/APAP.   Ok to refill??  Last office visit 06/14/2019  Last refill 11/21/2019.

## 2019-12-26 ENCOUNTER — Encounter: Payer: Self-pay | Admitting: Nurse Practitioner

## 2019-12-26 ENCOUNTER — Ambulatory Visit (INDEPENDENT_AMBULATORY_CARE_PROVIDER_SITE_OTHER): Payer: Medicare HMO | Admitting: Nurse Practitioner

## 2019-12-26 ENCOUNTER — Other Ambulatory Visit: Payer: Self-pay

## 2019-12-26 VITALS — BP 118/78 | HR 79 | Temp 97.9°F | Ht 67.0 in | Wt 171.6 lb

## 2019-12-26 DIAGNOSIS — M542 Cervicalgia: Secondary | ICD-10-CM

## 2019-12-26 DIAGNOSIS — M5030 Other cervical disc degeneration, unspecified cervical region: Secondary | ICD-10-CM | POA: Insufficient documentation

## 2019-12-26 DIAGNOSIS — H6122 Impacted cerumen, left ear: Secondary | ICD-10-CM | POA: Insufficient documentation

## 2019-12-26 MED ORDER — TIZANIDINE HCL 4 MG PO TABS: 4.0000 mg | ORAL_TABLET | Freq: Four times a day (QID) | ORAL | 0 refills | Status: DC | PRN

## 2019-12-26 MED ORDER — OXYCODONE-ACETAMINOPHEN 5-325 MG PO TABS: 1.0000 | ORAL_TABLET | Freq: Three times a day (TID) | ORAL | 0 refills | Status: DC | PRN
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
## 2019-12-26 NOTE — Patient Instructions (Signed)
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
## 2019-12-26 NOTE — Assessment & Plan Note (Signed)
Recommended discontinuing use of Q-tips.  Can try OTC ear drops with hydrogen peroxide to loosen wax in right ear.  Left ear unremarkable; unclear what could be causing sensation of movement patient is feeling in this ear.  May be related to hair in ear or cerumen that he subsequently removes.

## 2019-12-26 NOTE — Telephone Encounter (Signed)
Pt needs OV for routine med check

## 2019-12-26 NOTE — Telephone Encounter (Signed)
Letter sent.

## 2019-12-27 ENCOUNTER — Ambulatory Visit (HOSPITAL_COMMUNITY)
Admission: RE | Admit: 2019-12-27 | Discharge: 2019-12-27 | Disposition: A | Discharge status: Home / Self Care | Payer: Medicare HMO | Source: Ambulatory Visit | Attending: Nurse Practitioner | Admitting: Nurse Practitioner

## 2019-12-27 DIAGNOSIS — M542 Cervicalgia: Secondary | ICD-10-CM | POA: Insufficient documentation

## 2019-12-28 ENCOUNTER — Telehealth: Payer: Self-pay | Admitting: Family Medicine

## 2019-12-28 NOTE — Telephone Encounter (Signed)
X ray results 

## 2019-12-29 ENCOUNTER — Other Ambulatory Visit: Payer: Self-pay

## 2019-12-29 ENCOUNTER — Telehealth: Payer: Self-pay

## 2019-12-29 DIAGNOSIS — M542 Cervicalgia: Secondary | ICD-10-CM

## 2019-12-29 NOTE — Telephone Encounter (Signed)
Spoke with pt, referral placed.  

## 2019-12-29 NOTE — Telephone Encounter (Signed)
Spoke with pt about imaging results. Pt is in agreement for neurosurgery. Referral has been placed.

## 2020-01-02 ENCOUNTER — Ambulatory Visit: Payer: Medicare HMO | Admitting: Family Medicine

## 2020-01-02 ENCOUNTER — Other Ambulatory Visit: Payer: Self-pay

## 2020-01-02 ENCOUNTER — Encounter: Payer: Self-pay | Admitting: Family Medicine

## 2020-01-02 VITALS — BP 120/75 | HR 76 | Temp 97.4°F | Wt 170.0 lb

## 2020-01-02 DIAGNOSIS — E039 Hypothyroidism, unspecified: Secondary | ICD-10-CM | POA: Diagnosis not present

## 2020-01-02 DIAGNOSIS — I25708 Atherosclerosis of coronary artery bypass graft(s), unspecified, with other forms of angina pectoris: Secondary | ICD-10-CM

## 2020-01-02 DIAGNOSIS — S46111A Strain of muscle, fascia and tendon of long head of biceps, right arm, initial encounter: Secondary | ICD-10-CM | POA: Diagnosis not present

## 2020-01-02 DIAGNOSIS — L570 Actinic keratosis: Secondary | ICD-10-CM

## 2020-01-02 DIAGNOSIS — E782 Mixed hyperlipidemia: Secondary | ICD-10-CM | POA: Diagnosis not present

## 2020-01-02 DIAGNOSIS — Z125 Encounter for screening for malignant neoplasm of prostate: Secondary | ICD-10-CM | POA: Diagnosis not present

## 2020-01-02 DIAGNOSIS — M4310 Spondylolisthesis, site unspecified: Secondary | ICD-10-CM

## 2020-01-02 DIAGNOSIS — M5030 Other cervical disc degeneration, unspecified cervical region: Secondary | ICD-10-CM

## 2020-01-02 DIAGNOSIS — N529 Male erectile dysfunction, unspecified: Secondary | ICD-10-CM | POA: Diagnosis not present

## 2020-01-02 DIAGNOSIS — I10 Essential (primary) hypertension: Secondary | ICD-10-CM

## 2020-01-02 MED ORDER — METHYLPREDNISOLONE 4 MG PO TBPK: ORAL_TABLET | ORAL | 0 refills | Status: DC

## 2020-01-02 NOTE — Assessment & Plan Note (Signed)
Bp controlled No changes to meds Lipid panel- fasting done On statin

## 2020-01-02 NOTE — Assessment & Plan Note (Signed)
Continue thyroid hormone replacement Check TFT

## 2020-01-02 NOTE — Assessment & Plan Note (Signed)
Prn viagra  Check PSA for prostate cancer screening
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
## 2020-01-02 NOTE — Assessment & Plan Note (Signed)
Discussed referral , he declines at this time Advised to use muscle relaxer for a few days for ongoing spasm No overt radicular symptoms today

## 2020-01-03 ENCOUNTER — Encounter: Payer: Self-pay | Admitting: *Deleted

## 2020-01-03 LAB — COMPREHENSIVE METABOLIC PANEL
AG Ratio: 1.7 (calc) (ref 1.0–2.5)
ALT: 13 U/L (ref 9–46)
AST: 18 U/L (ref 10–35)
Albumin: 4.5 g/dL (ref 3.6–5.1)
Alkaline phosphatase (APISO): 72 U/L (ref 35–144)
BUN: 12 mg/dL (ref 7–25)
CO2: 29 mmol/L (ref 20–32)
Calcium: 9.5 mg/dL (ref 8.6–10.3)
Chloride: 101 mmol/L (ref 98–110)
Creat: 0.89 mg/dL (ref 0.70–1.18)
Globulin: 2.6 g/dL (calc) (ref 1.9–3.7)
Glucose, Bld: 111 mg/dL — ABNORMAL HIGH (ref 65–99)
Potassium: 4.4 mmol/L (ref 3.5–5.3)
Sodium: 139 mmol/L (ref 135–146)
Total Bilirubin: 0.6 mg/dL (ref 0.2–1.2)
Total Protein: 7.1 g/dL (ref 6.1–8.1)

## 2020-01-03 LAB — CBC WITH DIFFERENTIAL/PLATELET
Absolute Monocytes: 813 cells/uL (ref 200–950)
Basophils Absolute: 33 cells/uL (ref 0–200)
Basophils Relative: 0.5 %
Eosinophils Absolute: 293 cells/uL (ref 15–500)
Eosinophils Relative: 4.5 %
HCT: 42.1 % (ref 38.5–50.0)
Hemoglobin: 14.8 g/dL (ref 13.2–17.1)
Lymphs Abs: 1872 cells/uL (ref 850–3900)
MCH: 29.5 pg (ref 27.0–33.0)
MCHC: 35.2 g/dL (ref 32.0–36.0)
MCV: 84 fL (ref 80.0–100.0)
MPV: 8.8 fL (ref 7.5–12.5)
Monocytes Relative: 12.5 %
Neutro Abs: 3491 cells/uL (ref 1500–7800)
Neutrophils Relative %: 53.7 %
Platelets: 355 10*3/uL (ref 140–400)
RBC: 5.01 10*6/uL (ref 4.20–5.80)
RDW: 12.7 % (ref 11.0–15.0)
Total Lymphocyte: 28.8 %
WBC: 6.5 10*3/uL (ref 3.8–10.8)

## 2020-01-03 LAB — LIPID PANEL
Cholesterol: 153 mg/dL (ref <200)
HDL: 45 mg/dL (ref >=40)
LDL Cholesterol (Calc): 75 mg/dL (calc)
Non-HDL Cholesterol (Calc): 108 mg/dL (calc) (ref <130)
Total CHOL/HDL Ratio: 3.4 (calc) (ref <5.0)
Triglycerides: 251 mg/dL — ABNORMAL HIGH (ref <150)

## 2020-01-03 LAB — T3, FREE: T3, Free: 3.6 pg/mL (ref 2.3–4.2)

## 2020-01-03 LAB — TSH: TSH: 1.02 mIU/L (ref 0.40–4.50)

## 2020-01-03 LAB — PSA: PSA: 1.41 ng/mL (ref <=4.0)

## 2020-01-30 ENCOUNTER — Other Ambulatory Visit: Payer: Self-pay | Admitting: *Deleted

## 2020-01-30 MED ORDER — OXYCODONE-ACETAMINOPHEN 5-325 MG PO TABS
1.0000 | ORAL_TABLET | Freq: Three times a day (TID) | ORAL | 0 refills | Status: DC | PRN
Start: 2020-01-30 — End: 2020-02-27

## 2020-01-30 NOTE — Telephone Encounter (Signed)
Ok to refill??  Last office visit 01/02/2020.  Last refill 12/26/2019.

## 2020-02-15 ENCOUNTER — Telehealth: Payer: Self-pay | Admitting: Pharmacist

## 2020-02-15 NOTE — Progress Notes (Signed)
Subjective:    Patient ID: Stephen Booker, male    DOB: 12/17/1950, 70 y.o.   MRN: [id number]  HPI: Stephen Booker is a 70 y.o. male presenting for ankle pain.  Chief Complaint  Patient presents with   Pain    Just in left foot, think he may be having gout flare. Pain scale 10 per pt. Taking pain pills that he has for other conditions. Meds are not working. Pt wants it to go on record that he does have pain meds to count, they are not being abused are sold. Pain started 01/22/20   FOOT PAIN Started while watching football game; same pain as when he hurt hand; could not let the sheets touch his hand.  After the ankle pain started, he could not stand on ankle and was bedridden.  Gradually, the pain started getting better and better. Duration: days - started January 16 Involved foot: left Mechanism of injury: unknown Location: left ankle  Onset: sudden  Severity: severe  Quality: burning, sharp Frequency: intermittent Radiation: yes; to toes at home Aggravating factors: stepping/waling a certain way  Alleviating factors: time, ice  Status: better; not all of the way Treatments attempted: ice, pain medication  Relief with NSAIDs?:  No NSAIDs Taken Weakness with weight bearing or walking: yes Morning stiffness: no Swelling: yes Redness: no Bruising: no Paresthesias / decreased sensation: sometimes tingling to toes  Fevers:no  Allergies  Allergen Reactions   Statins     Myalgia    Penicillins Rash    Outpatient Encounter Medications as of 02/16/2020  Medication Sig   amLODipine (NORVASC) 5 MG tablet TAKE 1 TABLET BY MOUTH EVERY DAY   aspirin EC 81 MG tablet Take 81 mg by mouth daily.   Evolocumab (REPATHA SURECLICK) 981 MG/ML SOAJ Inject 140 mg into the skin every 14 (fourteen) days.   levothyroxine (SYNTHROID) 50 MCG tablet TAKE 1 TABLET(50 MCG) BY MOUTH DAILY BEFORE BREAKFAST   metoprolol succinate (TOPROL-XL) 50 MG 24 hr tablet Take 1 tablet (50 mg total) by mouth  daily. Take with or immediately following a meal.   nitroGLYCERIN (NITROSTAT) 0.4 MG SL tablet Place 1 tablet (0.4 mg total) under the tongue every 5 (five) minutes as needed for chest pain.   oxyCODONE-acetaminophen (PERCOCET/ROXICET) 5-325 MG tablet Take 1 tablet by mouth every 8 (eight) hours as needed.   Papav-Phentolamine-Alprostadil 12-1-0.01 MG/ML SOLN by Intracavernosal route.   predniSONE (DELTASONE) 10 MG tablet Take 1 tablet (10 mg total) by mouth daily with breakfast. Take 40mg  on days 1-2. Take 30mg  on days 3-4. Take 20mg  on days 5-6. Take 10mg  on days 7-8. Take 5mg  on days 9-10, then stop.   sildenafil (REVATIO) 20 MG tablet Take 1-5 tablets by mouth as needed.   [DISCONTINUED] doxycycline (VIBRA-TABS) 100 MG tablet Take 1 tablet (100 mg total) by mouth 2 (two) times daily.   [DISCONTINUED] methylPREDNISolone (MEDROL DOSEPAK) 4 MG TBPK tablet Take as directed on package   [DISCONTINUED] tiZANidine (ZANAFLEX) 4 MG tablet Take 1 tablet (4 mg total) by mouth every 6 (six) hours as needed for muscle spasms. Do not take with oxycodone.  Take first dose at night time and monitor for drowsiness.  If this medication makes you drowsy, do not take while driving or operating heavy machinery.   No facility-administered encounter medications on file as of 02/16/2020.    Patient Active Problem List   Diagnosis Date Noted   DDD (degenerative disc disease), cervical 12/26/2019   Ceruminosis, left 12/26/2019  Hypertension 10/10/2016   ED (erectile dysfunction) 06/10/2016   Hyperlipidemia 06/09/2016   Chronic insomnia 06/09/2016   Anxiety with depression 06/09/2016   CAD (coronary artery disease) 03/07/2016   Diverticulosis 03/07/2016   Chronic pain syndrome 03/07/2016   Lumbar spondylosis 03/07/2016   Hypothyroidism 03/07/2016   OA (osteoarthritis) of knee 03/07/2016   Arthritis of hand 03/07/2016    Past Medical History:  Diagnosis Date   Arthritis    Back pain     Coronary artery disease    a. s/p CABG in 05/2012 b. NST in 02/2017 showing no significant ischemia   Diverticulitis    Hyperlipidemia    Hypertension    S/P triple vessel bypass    Thyroid disease     Relevant past medical, surgical, family and social history reviewed and updated as indicated. Interim medical history since our last visit reviewed.  Review of Systems Per HPI unless specifically indicated above     Objective:    BP 136/78    Pulse 90    Temp 98 F (36.7 C)    Ht 5\' 7"  (1.702 m)    Wt 174 lb 9.6 oz (79.2 kg)    SpO2 98%    BMI 27.35 kg/m   Wt Readings from Last 3 Encounters:  02/16/20 174 lb 9.6 oz (79.2 kg)  01/02/20 170 lb (77.1 kg)  12/26/19 171 lb 9.6 oz (77.8 kg)    Physical Exam Vitals and nursing note reviewed.  Constitutional:      General: He is not in acute distress.    Appearance: Normal appearance. He is not toxic-appearing.  Musculoskeletal:        General: Normal range of motion.     Right ankle: No swelling. No tenderness. Normal pulse.     Left ankle: Swelling present. Tenderness present over the lateral malleolus. Normal range of motion. Normal pulse.  Skin:    General: Skin is warm and dry.     Capillary Refill: Capillary refill takes less than 2 seconds.     Coloration: Skin is not jaundiced or pale.     Findings: No erythema.  Neurological:     Mental Status: He is alert and oriented to person, place, and time.     Motor: No weakness.     Gait: Gait abnormal (walking with slight limp favoring left ankle).  Psychiatric:        Mood and Affect: Mood normal.        Behavior: Behavior normal.        Thought Content: Thought content normal.        Judgment: Judgment normal.       Assessment & Plan:  1. Acute left ankle pain Acute, improving.  Differentials include gout, ankle sprain, ankle fracture.  With no known injury, gout seems most likely.  Will check uric acid level and treat with corticosteroids.  Not a candidate for  NSAIDs or colchine given CAD.  Can also try ASO brace to help with stabilization and prevent further injury.  Return to clinic with no improvement.  - Uric acid - predniSONE (DELTASONE) 10 MG tablet; Take 1 tablet (10 mg total) by mouth daily with breakfast. Take 40mg  on days 1-2. Take 30mg  on days 3-4. Take 20mg  on days 5-6. Take 10mg  on days 7-8. Take 5mg  on days 9-10, then stop.  Dispense: 21 tablet; Refill: 0  2. Elevated serum glucose Patient with concerns about recent elevated glucose; would like to have HgbA1c checked.  Will check  today.  - Hemoglobin A1c    Follow up plan: Return if symptoms worsen or fail to improve.

## 2020-02-15 NOTE — Progress Notes (Addendum)
Chronic Care Management Pharmacy Assistant   Name: Stephen Booker  MRN: [id number] DOB: 1950/04/07  Reason for Encounter: Disease State For HTN.   Patient Questions:  1.  Have you seen any other providers since your last visit? No.   2.  Any changes in your medicines or health? No.   PCP : Alycia Rossetti, MD   Their chronic conditions include: CAD, HTN, Hypothyroidism, Osteoarthritis, HLD.  Office visits: 01/02/20 Dr. Buelah Manis. Labs done. STARTED Methylprednisolone 4 mg dose pack. 12/26/19 Noemi [facility]. For neck pain and left ear wax. X-rays taken. STARTED Tizanidine 4 mg every 6 hours PRN.  Consults: None since 11/29/19  Allergies:   Allergies  Allergen Reactions   Statins     Myalgia    Penicillins Rash    Medications: Outpatient Encounter Medications as of 02/15/2020  Medication Sig   amLODipine (NORVASC) 5 MG tablet TAKE 1 TABLET BY MOUTH EVERY DAY   aspirin EC 81 MG tablet Take 81 mg by mouth daily.   doxycycline (VIBRA-TABS) 100 MG tablet Take 1 tablet (100 mg total) by mouth 2 (two) times daily.   Evolocumab (REPATHA SURECLICK) 151 MG/ML SOAJ Inject 140 mg into the skin every 14 (fourteen) days.   levothyroxine (SYNTHROID) 50 MCG tablet TAKE 1 TABLET(50 MCG) BY MOUTH DAILY BEFORE BREAKFAST   methylPREDNISolone (MEDROL DOSEPAK) 4 MG TBPK tablet Take as directed on package   metoprolol succinate (TOPROL-XL) 50 MG 24 hr tablet Take 1 tablet (50 mg total) by mouth daily. Take with or immediately following a meal.   nitroGLYCERIN (NITROSTAT) 0.4 MG SL tablet Place 1 tablet (0.4 mg total) under the tongue every 5 (five) minutes as needed for chest pain.   oxyCODONE-acetaminophen (PERCOCET/ROXICET) 5-325 MG tablet Take 1 tablet by mouth every 8 (eight) hours as needed.   Papav-Phentolamine-Alprostadil 12-1-0.01 MG/ML SOLN by Intracavernosal route.   sildenafil (REVATIO) 20 MG tablet Take 1-5 tablets by mouth as needed.   tiZANidine (ZANAFLEX) 4 MG tablet Take 1  tablet (4 mg total) by mouth every 6 (six) hours as needed for muscle spasms. Do not take with oxycodone.  Take first dose at night time and monitor for drowsiness.  If this medication makes you drowsy, do not take while driving or operating heavy machinery.   No facility-administered encounter medications on file as of 02/15/2020.    Current Diagnosis: Patient Active Problem List   Diagnosis Date Noted   DDD (degenerative disc disease), cervical 12/26/2019   Ceruminosis, left 12/26/2019   Hypertension 10/10/2016   ED (erectile dysfunction) 06/10/2016   Hyperlipidemia 06/09/2016   Chronic insomnia 06/09/2016   Anxiety with depression 06/09/2016   CAD (coronary artery disease) 03/07/2016   Diverticulosis 03/07/2016   Chronic pain syndrome 03/07/2016   Lumbar spondylosis 03/07/2016   Hypothyroidism 03/07/2016   OA (osteoarthritis) of knee 03/07/2016   Arthritis of hand 03/07/2016    Goals Addressed   None    Reviewed chart prior to disease state call. Spoke with patient regarding BP  Recent Office Vitals: BP Readings from Last 3 Encounters:  01/02/20 120/75  12/26/19 118/78  06/14/19 132/68   Pulse Readings from Last 3 Encounters:  01/02/20 76  12/26/19 79  06/14/19 80    Wt Readings from Last 3 Encounters:  01/02/20 170 lb (77.1 kg)  12/26/19 171 lb 9.6 oz (77.8 kg)  06/14/19 183 lb (83 kg)     Kidney Function Lab Results  Component Value Date/Time   CREATININE 0.89 01/02/2020 12:14 PM  CREATININE 0.88 06/11/2018 08:06 AM   GFRNONAA >60 03/11/2017 03:46 AM   GFRAA >60 03/11/2017 03:46 AM    BMP Latest Ref Rng & Units 01/02/2020 06/11/2018 12/18/2017  Glucose 65 - 99 mg/dL 111(H) 119(H) 98  BUN 7 - 25 mg/dL 12 14 20   Creatinine 0.70 - 1.18 mg/dL 0.89 0.88 0.99  BUN/Creat Ratio 6 - 22 (calc) NOT APPLICABLE NOT APPLICABLE NOT APPLICABLE  Sodium 122 - 146 mmol/L 139 142 142  Potassium 3.5 - 5.3 mmol/L 4.4 4.5 4.4  Chloride 98 - 110 mmol/L 101 106 104  CO2 20 -  32 mmol/L 29 26 25   Calcium 8.6 - 10.3 mg/dL 9.5 9.5 10.0    Current antihypertensive regimen:  Amlodipine 5 mg daily Metoprolol XL 50 mg daily  How often are you checking your Blood Pressure? infrequently   Current home BP readings: Patient stated he doesn't write his readings down but did agree to start checking his blood pressure more and writing them down for review.  What recent interventions/DTPs have been made by any provider to improve Blood Pressure control since last CPP Visit: None.  Any recent hospitalizations or ED visits since last visit with CPP? Patient stated no.  What diet changes have been made to improve Blood Pressure Control?  Patient stated he eats baked chicken, beef, fish cereal, rice, eggs, and bagels. He stated he does eat oatmeal cookies for something sweet . Patient stated he does not eat much vegetables or fruits. He stated he drinks two cups of apple cider daily and doesn't drink much water. Patient stated he doesn't eat processed foods from restaurants.   What exercise is being done to improve your Blood Pressure Control?  Patient stated he activity level is very low because of his health issues. Patient stated he does all of his house duties. Patient stated he gets out and drives to get his medication.  Adherence Review: Is the patient currently on ACE/ARB medication? No.   Does the patient have >5 day gap between last estimated fill dates? No   Follow-Up:  Pharmacist Review   Charlann Lange, RMA Clinical Pharmacist Assistant [phone]  6 minutes spent in review, coordination, and documentation.  Reviewed by: Beverly Milch, PharmD Clinical Pharmacist [facility] [phone]

## 2020-02-16 ENCOUNTER — Ambulatory Visit (INDEPENDENT_AMBULATORY_CARE_PROVIDER_SITE_OTHER): Payer: Medicare HMO | Admitting: Nurse Practitioner

## 2020-02-16 ENCOUNTER — Encounter: Payer: Self-pay | Admitting: Nurse Practitioner

## 2020-02-16 ENCOUNTER — Other Ambulatory Visit: Payer: Self-pay

## 2020-02-16 VITALS — BP 136/78 | HR 90 | Temp 98.0°F | Ht 67.0 in | Wt 174.6 lb

## 2020-02-16 DIAGNOSIS — R739 Hyperglycemia, unspecified: Secondary | ICD-10-CM

## 2020-02-16 DIAGNOSIS — M25572 Pain in left ankle and joints of left foot: Secondary | ICD-10-CM | POA: Diagnosis not present

## 2020-02-16 MED ORDER — PREDNISONE 10 MG PO TABS
10.0000 mg | ORAL_TABLET | Freq: Every day | ORAL | 0 refills | Status: DC
Start: 2020-02-16 — End: 2020-05-14

## 2020-02-16 NOTE — Patient Instructions (Signed)

## 2020-02-17 LAB — HEMOGLOBIN A1C
Hgb A1c MFr Bld: 5.9 % of total Hgb — ABNORMAL HIGH (ref <5.7)
Mean Plasma Glucose: 123 mg/dL
eAG (mmol/L): 6.8 mmol/L

## 2020-02-17 LAB — URIC ACID: Uric Acid, Serum: 6.1 mg/dL (ref 4.0–8.0)

## 2020-02-27 ENCOUNTER — Other Ambulatory Visit: Payer: Self-pay | Admitting: *Deleted

## 2020-02-27 ENCOUNTER — Telehealth: Payer: Self-pay | Admitting: Family Medicine

## 2020-02-27 MED ORDER — OXYCODONE-ACETAMINOPHEN 5-325 MG PO TABS: 1.0000 | ORAL_TABLET | Freq: Three times a day (TID) | ORAL | 0 refills | Status: AC | PRN

## 2020-02-27 NOTE — Telephone Encounter (Signed)
Please call and inquire if he has a specific question about his labs that I can answer - please see last result note.  Not sure if he was informed of the result.

## 2020-02-27 NOTE — Telephone Encounter (Signed)
Pt called asking to speak with you about his Lab results from his previous appt.He asked that you please give him a call back.  Cb#: [phone]

## 2020-02-27 NOTE — Telephone Encounter (Signed)
Received call from patient.   Requested refill on Oxycodone/APAP.   Ok to refill??  Last office visit 02/16/2020.  Last refill 01/30/2020.

## 2020-02-28 NOTE — Telephone Encounter (Signed)
Spoke with pt, aware of lab results. He is asking for a pain med to get him to the appt with his new pcp. Message with sent to NP requesting med

## 2020-02-29 ENCOUNTER — Other Ambulatory Visit: Payer: Self-pay | Admitting: Nurse Practitioner

## 2020-02-29 DIAGNOSIS — M25572 Pain in left ankle and joints of left foot: Secondary | ICD-10-CM

## 2020-02-29 NOTE — Progress Notes (Signed)
Spoke with pt to let him know xray orders have been place for him at [facility] imaging

## 2020-03-01 ENCOUNTER — Other Ambulatory Visit: Payer: Self-pay

## 2020-03-01 ENCOUNTER — Ambulatory Visit (HOSPITAL_COMMUNITY)
Admission: RE | Admit: 2020-03-01 | Discharge: 2020-03-01 | Disposition: A | Discharge status: Home / Self Care | Payer: Medicare HMO | Source: Ambulatory Visit | Attending: Nurse Practitioner | Admitting: Nurse Practitioner

## 2020-03-01 DIAGNOSIS — M19072 Primary osteoarthritis, left ankle and foot: Secondary | ICD-10-CM | POA: Diagnosis not present

## 2020-03-01 DIAGNOSIS — M25572 Pain in left ankle and joints of left foot: Secondary | ICD-10-CM | POA: Diagnosis not present

## 2020-03-01 DIAGNOSIS — M7732 Calcaneal spur, left foot: Secondary | ICD-10-CM | POA: Diagnosis not present

## 2020-03-01 DIAGNOSIS — I739 Peripheral vascular disease, unspecified: Secondary | ICD-10-CM | POA: Diagnosis not present

## 2020-03-05 ENCOUNTER — Encounter: Payer: Self-pay | Admitting: Nurse Practitioner

## 2020-03-05 ENCOUNTER — Other Ambulatory Visit: Payer: Self-pay

## 2020-03-05 ENCOUNTER — Ambulatory Visit (INDEPENDENT_AMBULATORY_CARE_PROVIDER_SITE_OTHER): Payer: Medicare HMO | Admitting: Nurse Practitioner

## 2020-03-05 DIAGNOSIS — N529 Male erectile dysfunction, unspecified: Secondary | ICD-10-CM

## 2020-03-05 DIAGNOSIS — E039 Hypothyroidism, unspecified: Secondary | ICD-10-CM

## 2020-03-05 DIAGNOSIS — G894 Chronic pain syndrome: Secondary | ICD-10-CM

## 2020-03-05 DIAGNOSIS — M19072 Primary osteoarthritis, left ankle and foot: Secondary | ICD-10-CM
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
## 2020-03-05 NOTE — Assessment & Plan Note (Signed)
-  takes chronic opioids

## 2020-03-05 NOTE — Assessment & Plan Note (Signed)
-  PRN sildenafil -took injections previously

## 2020-03-05 NOTE — Assessment & Plan Note (Signed)
-  takes levothyroxine daily

## 2020-03-07 ENCOUNTER — Other Ambulatory Visit: Payer: Self-pay

## 2020-03-07 ENCOUNTER — Ambulatory Visit: Payer: Medicare HMO | Admitting: Podiatry

## 2020-03-07 DIAGNOSIS — M7752 Other enthesopathy of left foot: Secondary | ICD-10-CM

## 2020-03-07 DIAGNOSIS — M10072 Idiopathic gout, left ankle and foot: Secondary | ICD-10-CM

## 2020-03-11 DIAGNOSIS — M7752 Other enthesopathy of left foot: Secondary | ICD-10-CM | POA: Diagnosis not present

## 2020-03-11 DIAGNOSIS — M10072 Idiopathic gout, left ankle and foot: Secondary | ICD-10-CM | POA: Diagnosis not present

## 2020-03-11 MED ORDER — BETAMETHASONE SOD PHOS & ACET 6 (3-3) MG/ML IJ SUSP
3.0000 mg | Freq: Once | INTRAMUSCULAR | Status: AC
  Administered 2020-03-11: 3 mg via INTRA_ARTICULAR

## 2020-03-11 NOTE — Progress Notes (Signed)
   Subjective:  70 y.o. male presenting today as a new patient for evaluation of left ankle pain is been going on approximately 2 months now.  Patient states that on January 22, 2020 he developed left ankle pain idiopathically.  He denies a history of injury.  Originally it felt like his ankle was on fire.  He was unable to apply pressure.  X-rays were taken on March 03, 2020 which were noncontributory to the patient's symptoms.  He is concerned for possible gout.  He does have a history of gout.  He presents for further treatment and evaluation   Past Medical History:  Diagnosis Date  . Arthritis   . Back pain   . Coronary artery disease    a. s/p CABG in 05/2012 b. NST in 02/2017 showing no significant ischemia  . Diverticulitis   . Hyperlipidemia   . Hypertension   . S/P triple vessel bypass   . Thyroid disease      Objective / Physical Exam:  General:  The patient is alert and oriented x3 in no acute distress. Dermatology:  Skin is warm, dry and supple bilateral lower extremities. Negative for open lesions or macerations. Vascular:  Palpable pedal pulses bilaterally. No edema or erythema noted. Capillary refill within normal limits. Neurological:  Epicritic and protective threshold grossly intact bilaterally.  Musculoskeletal Exam:  Pain on palpation to the anterior lateral medial aspects of the patient's left ankle. Mild edema noted. Range of motion within normal limits to all pedal and ankle joints bilateral. Muscle strength 5/5 in all groups bilateral.   Assessment: 1.  Acute idiopathic gout left ankle; improving  Plan of Care:  1. Patient was evaluated.  2. Injection of 0.5 mL Celestone Soluspan injected in the patient's left ankle. 3.  The patient states that the left ankle has slowly improved.  We are hoping that with the injection he starts to feel much better For.  Recommend getting educated on gout and to avoid foods high in purine's 5.  Return to clinic as  needed   Edrick Kins, DPM [facility]  Dr. Edrick Kins, [address]                                        [place],  [address]                Office [phone]  Fax [phone]

## 2020-03-14 ENCOUNTER — Encounter: Payer: Self-pay | Admitting: Family Medicine

## 2020-03-15 ENCOUNTER — Other Ambulatory Visit: Payer: Self-pay

## 2020-03-15 ENCOUNTER — Encounter: Payer: Self-pay | Admitting: Nurse Practitioner

## 2020-03-15 ENCOUNTER — Ambulatory Visit: Payer: Medicare HMO | Admitting: Nurse Practitioner

## 2020-03-15 VITALS — BP 130/78 | HR 76 | Temp 97.7°F | Ht 67.0 in | Wt 174.0 lb

## 2020-03-15 DIAGNOSIS — M25552 Pain in left hip: Secondary | ICD-10-CM

## 2020-03-15 NOTE — Progress Notes (Signed)
Subjective:    Patient ID: Stephen Booker, male    DOB: October 12, 1950, 70 y.o.   MRN: [id number]  HPI: Stephen Booker is a 70 y.o. male presenting for hip pain.  Chief Complaint  Patient presents with  . Pain    Left hip pain getting worse by the day, has some popping noise in the in hip when bending. Pain feels as if it is deep and sharp within muscle   HIP PAIN Reports pain has always been there, with some popping noise.  Now, can't walk 10-15 steps without having severe pain. Duration: weeks Involved hip: left  Mechanism of injury: unknown Location: lateral Onset: sudden  Severity:  severe Quality: sharp Frequency: intermittent Radiation: no Aggravating factors: walking, bending a certain way    Alleviating factors: nothing tried  Status: worse Treatments attempted: nothing   Relief with NSAIDs?: no Weakness with weight bearing: yes Weakness with walking: yes Paresthesias / decreased sensation: no Swelling: no Redness:no Fevers: no  Allergies  Allergen Reactions  . Statins     Myalgia   . Penicillins Rash    Outpatient Encounter Medications as of 03/15/2020  Medication Sig  . amLODipine (NORVASC) 5 MG tablet TAKE 1 TABLET BY MOUTH EVERY DAY  . aspirin EC 81 MG tablet Take 81 mg by mouth daily.  . Evolocumab (REPATHA SURECLICK) 321 MG/ML SOAJ Inject 140 mg into the skin every 14 (fourteen) days.  Marland Kitchen levothyroxine (SYNTHROID) 50 MCG tablet TAKE 1 TABLET(50 MCG) BY MOUTH DAILY BEFORE BREAKFAST  . metoprolol succinate (TOPROL-XL) 50 MG 24 hr tablet Take 1 tablet (50 mg total) by mouth daily. Take with or immediately following a meal.  . nitroGLYCERIN (NITROSTAT) 0.4 MG SL tablet Place 1 tablet (0.4 mg total) under the tongue every 5 (five) minutes as needed for chest pain.  Marland Kitchen oxyCODONE-acetaminophen (PERCOCET/ROXICET) 5-325 MG tablet Take 1 tablet by mouth every 8 (eight) hours as needed.  . Papav-Phentolamine-Alprostadil 12-1-0.01 MG/ML SOLN by Intracavernosal route.  .  predniSONE (DELTASONE) 10 MG tablet Take 1 tablet (10 mg total) by mouth daily with breakfast. Take 40mg  on days 1-2. Take 30mg  on days 3-4. Take 20mg  on days 5-6. Take 10mg  on days 7-8. Take 5mg  on days 9-10, then stop.  . sildenafil (REVATIO) 20 MG tablet Take 1-5 tablets by mouth as needed.   No facility-administered encounter medications on file as of 03/15/2020.    Patient Active Problem List   Diagnosis Date Noted  . DDD (degenerative disc disease), cervical 12/26/2019  . Hypertension 10/10/2016  . ED (erectile dysfunction) 06/10/2016  . Hyperlipidemia 06/09/2016  . Chronic insomnia 06/09/2016  . Anxiety with depression 06/09/2016  . CAD (coronary artery disease) 03/07/2016  . Diverticulosis 03/07/2016  . Chronic pain syndrome 03/07/2016  . Lumbar spondylosis 03/07/2016  . Hypothyroidism 03/07/2016  . OA (osteoarthritis) of knee 03/07/2016  . Arthritis of hand 03/07/2016    Past Medical History:  Diagnosis Date  . Arthritis   . Back pain   . Coronary artery disease    a. s/p CABG in 05/2012 b. NST in 02/2017 showing no significant ischemia  . Diverticulitis   . Hyperlipidemia   . Hypertension   . S/P triple vessel bypass   . Thyroid disease     Relevant past medical, surgical, family and social history reviewed and updated as indicated. Interim medical history since our last visit reviewed.  Review of Systems Per HPI unless specifically indicated above     Objective:  BP 130/78   Pulse 76   Temp 97.7 F (36.5 C)   Ht 5\' 7"  (1.702 m)   Wt 174 lb (78.9 kg)   SpO2 97%   BMI 27.25 kg/m   Wt Readings from Last 3 Encounters:  03/15/20 174 lb (78.9 kg)  03/05/20 178 lb (80.7 kg)  02/16/20 174 lb 9.6 oz (79.2 kg)    Physical Exam Vitals and nursing note reviewed.  Constitutional:      General: He is not in acute distress.    Appearance: Normal appearance. He is not toxic-appearing.  Musculoskeletal:     Right hip: Normal.     Left hip: Bony tenderness  present. No tenderness. Normal range of motion.  Skin:    Coloration: Skin is not jaundiced or pale.     Findings: No erythema.  Neurological:     Mental Status: He is alert and oriented to person, place, and time.  Psychiatric:        Mood and Affect: Mood normal.        Behavior: Behavior normal.        Thought Content: Thought content normal.        Judgment: Judgment normal.       Assessment & Plan:  1. Left hip pain Acute.  Unclear etiology, although osteoarthritis high on differential due to chronic degenerative changes elsewhere and chronic abnormal gait.  Will defer imaging to orthopedics; does not sound like trochanteric bursitis at this time.  Declines medication for now; will place referral to Orthopedics.   - Ambulatory referral to Orthopedics     Follow up plan: Return for with new PCP .

## 2020-03-15 NOTE — Patient Instructions (Addendum)
[facility] in [place] - urgent care walk-in

## 2020-04-03 DIAGNOSIS — S46219A Strain of muscle, fascia and tendon of other parts of biceps, unspecified arm, initial encounter: Secondary | ICD-10-CM | POA: Diagnosis not present

## 2020-04-03 DIAGNOSIS — F419 Anxiety disorder, unspecified: Secondary | ICD-10-CM | POA: Diagnosis not present

## 2020-04-03 DIAGNOSIS — H6120 Impacted cerumen, unspecified ear: Secondary | ICD-10-CM | POA: Diagnosis not present

## 2020-04-03 DIAGNOSIS — N529 Male erectile dysfunction, unspecified: Secondary | ICD-10-CM | POA: Diagnosis not present

## 2020-04-03 DIAGNOSIS — E785 Hyperlipidemia, unspecified: Secondary | ICD-10-CM | POA: Diagnosis not present

## 2020-04-03 DIAGNOSIS — M25552 Pain in left hip: Secondary | ICD-10-CM | POA: Diagnosis not present

## 2020-04-03 DIAGNOSIS — K5790 Diverticulosis of intestine, part unspecified, without perforation or abscess without bleeding: Secondary | ICD-10-CM | POA: Diagnosis not present

## 2020-04-03 DIAGNOSIS — F330 Major depressive disorder, recurrent, mild: Secondary | ICD-10-CM | POA: Diagnosis not present

## 2020-04-03 DIAGNOSIS — E039 Hypothyroidism, unspecified: Secondary | ICD-10-CM | POA: Diagnosis not present

## 2020-04-03 DIAGNOSIS — G4700 Insomnia, unspecified: Secondary | ICD-10-CM | POA: Diagnosis not present

## 2020-04-05 ENCOUNTER — Ambulatory Visit: Payer: Medicare HMO | Admitting: Orthopedic Surgery

## 2020-04-10 ENCOUNTER — Telehealth: Payer: Self-pay

## 2020-04-10 NOTE — Telephone Encounter (Signed)
Verified pt is using Trimix injections again and rescheduled. Pt had not been seen in 1 year.

## 2020-04-10 NOTE — Telephone Encounter (Signed)
Got request from Pharmacy asking for refill of Trimix. Pt has not been sen since last year and note said he was not using trimix at that time. Called pt to verify if using.

## 2020-05-08 ENCOUNTER — Telehealth: Payer: Self-pay

## 2020-05-14 ENCOUNTER — Encounter: Payer: Self-pay | Admitting: Orthopedic Surgery

## 2020-05-14 ENCOUNTER — Other Ambulatory Visit: Payer: Self-pay

## 2020-05-14 ENCOUNTER — Ambulatory Visit: Payer: Medicare HMO | Admitting: Orthopedic Surgery

## 2020-05-14 ENCOUNTER — Ambulatory Visit: Payer: Medicare HMO

## 2020-05-14 VITALS — BP 113/73 | HR 68 | Ht 67.0 in | Wt 180.0 lb

## 2020-05-14 DIAGNOSIS — M25552 Pain in left hip: Secondary | ICD-10-CM | POA: Diagnosis not present

## 2020-05-14 DIAGNOSIS — G8929 Other chronic pain: Secondary | ICD-10-CM

## 2020-05-14 NOTE — Patient Instructions (Signed)
L5 SI ???
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
## 2020-05-31 ENCOUNTER — Other Ambulatory Visit: Payer: Self-pay

## 2020-05-31 ENCOUNTER — Encounter: Payer: Self-pay | Admitting: Cardiology

## 2020-05-31 ENCOUNTER — Ambulatory Visit: Payer: Medicare HMO | Admitting: Cardiology

## 2020-05-31 VITALS — BP 125/80 | HR 75 | Ht 67.0 in | Wt 187.0 lb

## 2020-05-31 DIAGNOSIS — I10 Essential (primary) hypertension: Secondary | ICD-10-CM

## 2020-05-31 DIAGNOSIS — E782 Mixed hyperlipidemia: Secondary | ICD-10-CM | POA: Diagnosis not present

## 2020-05-31 DIAGNOSIS — I25119 Atherosclerotic heart disease of native coronary artery with unspecified angina pectoris: Secondary | ICD-10-CM

## 2020-05-31 NOTE — Addendum Note (Signed)
Addended by: Christella Scheuermann C on: 05/31/2020 12:54 PM   Modules accepted: Orders

## 2020-05-31 NOTE — Progress Notes (Signed)
Cardiology Office Note  Date: 05/31/2020   ID: Stephen Booker, DOB Apr 24, 1950, MRN [id number]  PCP:  Eulogio Bear, NP  Cardiologist:  None Electrophysiologist:  None   Chief Complaint  Patient presents with  . Cardiac follow-up    History of Present Illness: Stephen Booker is a 70 y.o. male former patient of Dr. Bronson Ing now presenting to establish follow-up with me.  I reviewed his records and updated the chart.  He was last assessed via telehealth encounter back in May 2021.  He is here for a routine visit.  He does not report any active angina symptoms or nitroglycerin use.  States that he has not been exercising regularly, I talked with him about a walking plan and weight loss.  He is established with a new PCP in [place].  I reviewed his lab work, LDL was 75 back in December, he is tolerating Repatha well.  I reviewed the remainder of his medications which are noted below.  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  Past Medical History:  Diagnosis Date  . Arthritis   . Back pain   . CAD (coronary artery disease)    Status post CABG 2014  . Diverticulitis   . Essential hypertension   . Hyperlipidemia   . Hypothyroidism     Past Surgical History:  Procedure Laterality Date  . CORONARY ARTERY BYPASS GRAFT    . INGUINAL HERNIA REPAIR    . KNEE ARTHROSCOPY Right    x2  . KNEE ARTHROSCOPY Right    has had 2 right knee arthroscopies one when he was in his 64s the other in his 3s  . UMBILICAL HERNIA REPAIR      Current Outpatient Medications  Medication Sig Dispense Refill  . amLODipine (NORVASC) 5 MG tablet TAKE 1 TABLET BY MOUTH EVERY DAY 90 tablet 3  . aspirin EC 81 MG tablet Take 81 mg by mouth daily.    . Evolocumab (REPATHA SURECLICK) 384 MG/ML SOAJ Inject 140 mg into the skin every 14 (fourteen) days. 1 mL 11  . levothyroxine (SYNTHROID) 50 MCG tablet TAKE 1 TABLET(50 MCG) BY MOUTH DAILY BEFORE BREAKFAST  90 tablet 2  . metoprolol succinate (TOPROL-XL) 50 MG 24 hr tablet Take 1 tablet (50 mg total) by mouth daily. Take with or immediately following a meal. 90 tablet 3  . nitroGLYCERIN (NITROSTAT) 0.4 MG SL tablet Place 1 tablet (0.4 mg total) under the tongue every 5 (five) minutes as needed for chest pain. 25 tablet 3  . oxyCODONE-acetaminophen (PERCOCET/ROXICET) 5-325 MG tablet Take 1 tablet by mouth every 8 (eight) hours as needed. 90 tablet 0  . Papav-Phentolamine-Alprostadil 12-1-0.01 MG/ML SOLN by Intracavernosal route.     No current facility-administered medications for this visit.   Allergies:  Statins and Penicillins   ROS: No palpitations or syncope.  Physical Exam: VS:  BP 125/80   Pulse 75   Ht 5\' 7"  (1.702 m)   Wt 187 lb (84.8 kg)   SpO2 98%   BMI 29.29 kg/m , BMI Body mass index is 29.29 kg/m.  Wt Readings from Last 3 Encounters:  05/31/20 187 lb (84.8 kg)  05/14/20 180 lb (81.6 kg)  03/15/20 174 lb (78.9 kg)    General: Patient appears comfortable at rest. HEENT: Conjunctiva and lids normal, wearing a mask. Neck: Supple, no elevated JVP or carotid bruits, no thyromegaly. Lungs: Clear to auscultation, nonlabored breathing at rest. Cardiac: Regular rate  and rhythm, no S3 or significant systolic murmur, no pericardial rub. Extremities: No pitting edema.  ECG:  An ECG dated 03/11/2017 was personally reviewed today and demonstrated:  Sinus rhythm with R' in lead V1.  Recent Labwork: 01/02/2020: ALT 13; AST 18; BUN 12; Creat 0.89; Hemoglobin 14.8; Platelets 355; Potassium 4.4; Sodium 139; TSH 1.02     Component Value Date/Time   CHOL 153 01/02/2020 1214   CHOL 257 (H) 12/17/2017 1426   TRIG 251 (H) 01/02/2020 1214   HDL 45 01/02/2020 1214   HDL 53 12/17/2017 1426   CHOLHDL 3.4 01/02/2020 1214   VLDL 37 09/10/2016 0929   LDLCALC 75 01/02/2020 1214    Other Studies Reviewed Today:  Exercise Myoview 02/25/2017:  No diagnostic ST segment changes to indicate  ischemia. No chest pain reported. Mild hypertensive response. No arrhythmias. Low risk Duke treadmill score of 4.5.  Blood pressure demonstrated a hypertensive response to exercise.  Small, mild intensity, fixed inferior defect that is most prominent on rest imaging and consistent with soft tissue attenuation, less likely scar. No definitive ischemia.  This is a low risk study.  Nuclear stress EF: 64%.  Assessment and Plan:  1.  Multivessel CAD status post CABG in 2014.  He is doing well without angina on medical therapy.  I talked with him about a walking plan for exercise.  Continue aspirin, Norvasc, Toprol-XL, and Repatha. He has as needed nitroglycerin available.  2.  Mixed hyperlipidemia with history of statin intolerance.  He is doing well on Repatha.  Last LDL 75.  3.  Essential hypertension, blood pressure control is reasonable today on current regimen.  No changes were made.  Medication Adjustments/Labs and Tests Ordered: Current medicines are reviewed at length with the patient today.  Concerns regarding medicines are outlined above.   Tests Ordered: No orders of the defined types were placed in this encounter.   Medication Changes: No orders of the defined types were placed in this encounter.   Disposition:  Follow up 1 year.  Signed, Satira Sark, MD, [facility] 05/31/2020 10:44 AM    [facility] [employer]. [address], [place], [place] [address] Phone: [phone]; Fax: [phone]

## 2020-05-31 NOTE — Patient Instructions (Signed)
Medication Instructions:   Your physician recommends that you continue on your current medications as directed. Please refer to the Current Medication list given to you today.  *If you need a refill on your cardiac medications before your next appointment, please call your pharmacy*   Lab Work: None today  If you have labs (blood work) drawn today and your tests are completely normal, you will receive your results only by: . MyChart Message (if you have MyChart) OR . A paper copy in the mail If you have any lab test that is abnormal or we need to change your treatment, we will call you to review the results.   Testing/Procedures: None today   Follow-Up: At [facility], you and your health needs are our priority.  As part of our continuing mission to provide you with exceptional heart care, we have created designated Provider Care Teams.  These Care Teams include your primary Cardiologist (physician) and Advanced Practice Providers (APPs -  Physician Assistants and Nurse Practitioners) who all work together to provide you with the care you need, when you need it.  We recommend signing up for the patient portal called "MyChart".  Sign up information is provided on this After Visit Summary.  MyChart is used to connect with patients for Virtual Visits (Telemedicine).  Patients are able to view lab/test results, encounter notes, upcoming appointments, etc.  Non-urgent messages can be sent to your provider as well.   To learn more about what you can do with MyChart, go to [email or web address].    Your next appointment:   12 month(s)  The format for your next appointment:   In Person  Provider:   Samuel McDowell, MD   Other Instructions None   

## 2020-06-08 ENCOUNTER — Other Ambulatory Visit: Payer: Self-pay | Admitting: Cardiology

## 2020-06-18 ENCOUNTER — Ambulatory Visit: Payer: Medicare HMO | Admitting: Urology

## 2020-08-01 ENCOUNTER — Other Ambulatory Visit: Payer: Self-pay | Admitting: Urology

## 2020-09-11 ENCOUNTER — Telehealth: Payer: Medicare HMO | Admitting: Urology

## 2020-09-11 ENCOUNTER — Other Ambulatory Visit: Payer: Self-pay

## 2020-10-09 ENCOUNTER — Other Ambulatory Visit: Payer: Self-pay

## 2020-10-09 ENCOUNTER — Ambulatory Visit (INDEPENDENT_AMBULATORY_CARE_PROVIDER_SITE_OTHER): Payer: Medicare HMO | Admitting: Urology

## 2020-10-09 ENCOUNTER — Encounter: Payer: Self-pay | Admitting: Urology

## 2020-10-09 DIAGNOSIS — N138 Other obstructive and reflux uropathy: Secondary | ICD-10-CM | POA: Diagnosis not present

## 2020-10-09 DIAGNOSIS — R351 Nocturia: Secondary | ICD-10-CM

## 2020-10-09 DIAGNOSIS — N5201 Erectile dysfunction due to arterial insufficiency: Secondary | ICD-10-CM | POA: Diagnosis not present

## 2020-10-09 DIAGNOSIS — N401 Enlarged prostate with lower urinary tract symptoms: Secondary | ICD-10-CM

## 2020-10-09 MED ORDER — TADALAFIL 20 MG PO TABS: 20.0000 mg | ORAL_TABLET | Freq: Every day | ORAL | 5 refills | Status: AC | PRN

## 2020-10-09 MED ORDER — ALFUZOSIN HCL ER 10 MG PO TB24: 10.0000 mg | ORAL_TABLET | Freq: Every day | ORAL | 11 refills | Status: AC

## 2020-10-09 NOTE — Patient Instructions (Signed)
Benign Prostatic Hyperplasia Benign prostatic hyperplasia (BPH) is an enlarged prostate gland that is caused by the normal aging process and not by cancer. The prostate is a walnut-sized gland that is involved in the production of semen. It is located in front of the rectum and below the bladder. The bladder stores urine and the urethra is the tube that carries the urine out of the body. The prostate may get bigger as a man gets older. An enlarged prostate can press on the urethra. This can make it harder to pass urine. The build-up of urine in the bladder can cause infection. Back pressure and infection may progress to bladder damage and kidney (renal) failure. What are the causes? This condition is part of a normal aging process. However, not all men develop problems from this condition. If the prostate enlarges away from the urethra, urine flow will not be blocked. If it enlarges toward the urethra and compresses it, there will be problems passing urine. What increases the risk? This condition is more likely to develop in men over the age of 50 years. What are the signs or symptoms? Symptoms of this condition include: Getting up often during the night to urinate. Needing to urinate frequently during the day. Difficulty starting urine flow. Decrease in size and strength of your urine stream. Leaking (dribbling) after urinating. Inability to pass urine. This needs immediate treatment. Inability to completely empty your bladder. Pain when you pass urine. This is more common if there is also an infection. Urinary tract infection (UTI). How is this diagnosed? This condition is diagnosed based on your medical history, a physical exam, and your symptoms. Tests will also be done, such as: A post-void bladder scan. This measures any amount of urine that may remain in your bladder after you finish urinating. A digital rectal exam. In a rectal exam, your health care provider checks your prostate by  putting a lubricated, gloved finger into your rectum to feel the back of your prostate gland. This exam detects the size of your gland and any abnormal lumps or growths. An exam of your urine (urinalysis). A prostate specific antigen (PSA) screening. This is a blood test used to screen for prostate cancer. An ultrasound. This test uses sound waves to electronically produce a picture of your prostate gland. Your health care provider may refer you to a specialist in kidney and prostate diseases (urologist). How is this treated? Once symptoms begin, your health care provider will monitor your condition (active surveillance or watchful waiting). Treatment for this condition will depend on the severity of your condition. Treatment may include: Observation and yearly exams. This may be the only treatment needed if your condition and symptoms are mild. Medicines to relieve your symptoms, including: Medicines to shrink the prostate. Medicines to relax the muscle of the prostate. Surgery in severe cases. Surgery may include: Prostatectomy. In this procedure, the prostate tissue is removed completely through an open incision or with a laparoscope or robotics. Transurethral resection of the prostate (TURP). In this procedure, a tool is inserted through the opening at the tip of the penis (urethra). It is used to cut away tissue of the inner core of the prostate. The pieces are removed through the same opening of the penis. This removes the blockage. Transurethral incision (TUIP). In this procedure, small cuts are made in the prostate. This lessens the prostate's pressure on the urethra. Transurethral microwave thermotherapy (TUMT). This procedure uses microwaves to create heat. The heat destroys and removes a   small amount of prostate tissue. Transurethral needle ablation (TUNA). This procedure uses radio frequencies to destroy and remove a small amount of prostate tissue. Interstitial laser coagulation (ILC).  This procedure uses a laser to destroy and remove a small amount of prostate tissue. Transurethral electrovaporization (TUVP). This procedure uses electrodes to destroy and remove a small amount of prostate tissue. Prostatic urethral lift. This procedure inserts an implant to push the lobes of the prostate away from the urethra. Follow these instructions at home: Take over-the-counter and prescription medicines only as told by your health care provider. Monitor your symptoms for any changes. Contact your health care provider with any changes. Avoid drinking large amounts of liquid before going to bed or out in public. Avoid or reduce how much caffeine or alcohol you drink. Give yourself time when you urinate. Keep all follow-up visits as told by your health care provider. This is important. Contact a health care provider if: You have unexplained back pain. Your symptoms do not get better with treatment. You develop side effects from the medicine you are taking. Your urine becomes very dark or has a bad smell. Your lower abdomen becomes distended and you have trouble passing your urine. Get help right away if: You have a fever or chills. You suddenly cannot urinate. You feel lightheaded, or very dizzy, or you faint. There are large amounts of blood or clots in the urine. Your urinary problems become hard to manage. You develop moderate to severe low back or flank pain. The flank is the side of your body between the ribs and the hip. These symptoms may represent a serious problem that is an emergency. Do not wait to see if the symptoms will go away. Get medical help right away. Call your local emergency services (911 in the [place] Do not drive yourself to the hospital. Summary Benign prostatic hyperplasia (BPH) is an enlarged prostate that is caused by the normal aging process and not by cancer. An enlarged prostate can press on the urethra. This can make it hard to pass urine. This  condition is part of a normal aging process and is more likely to develop in men over the age of 50 years. Get help right away if you suddenly cannot urinate. This information is not intended to replace advice given to you by your health care provider. Make sure you discuss any questions you have with your health care provider. Document Revised: 04/04/2020 Document Reviewed: 09/01/2019 Elsevier Patient Education  2022 Elsevier Inc.  

## 2020-10-09 NOTE — Progress Notes (Signed)
10/09/2020 3:36 PM   Stephen Booker Feb 24, 1950 [id number]  Referring provider: Buzzy Han, MD [place],  [place] [address]  Patient location: home Physician location: office I connected with  Khiyan Crace on 10/09/20 by a video enabled telemedicine application and verified that I am speaking with the correct person using two identifiers.   I discussed the limitations of evaluation and management by telemedicine. The patient expressed understanding and agreed to proceed.    Followup erectile dysfunction and nocturia   HPI: Stephen Booker is a 70yo here for followup for ED and nocturia. He continues to have issues getting and maintaining an erection. He has tried trimix in the past which caused burning with the injection. He currently uses sildenafil prn with mixed results. He has nocturia 2x. He has an intermittent weak stream. He has urinary hesitancy, urinary frequency and occasional urinary urgency. No other complaints today PSA 1.41 a year ago   PMH: Past Medical History:  Diagnosis Date   Arthritis    Back pain    CAD (coronary artery disease)    Status post CABG 2014   Diverticulitis    Essential hypertension    Hyperlipidemia    Hypothyroidism     Surgical History: Past Surgical History:  Procedure Laterality Date   CORONARY ARTERY BYPASS GRAFT     INGUINAL HERNIA REPAIR     KNEE ARTHROSCOPY Right    x2   KNEE ARTHROSCOPY Right    has had 2 right knee arthroscopies one when he was in his 12s the other in his 97L   [place] Medications:  Allergies as of 10/09/2020       Reactions   Statins    Myalgia   Penicillins Rash        Medication List        Accurate as of October 09, 2020  3:36 PM. If you have any questions, ask your nurse or doctor.          amLODipine 5 MG tablet Commonly known as: NORVASC TAKE 1 TABLET BY MOUTH EVERY DAY   aspirin EC 81 MG tablet Take 81 mg by mouth daily.    levothyroxine 50 MCG tablet Commonly known as: SYNTHROID TAKE 1 TABLET(50 MCG) BY MOUTH DAILY BEFORE BREAKFAST   metoprolol succinate 50 MG 24 hr tablet Commonly known as: TOPROL-XL Take 1 tablet (50 mg total) by mouth daily. Take with or immediately following a meal.   nitroGLYCERIN 0.4 MG SL tablet Commonly known as: NITROSTAT Place 1 tablet (0.4 mg total) under the tongue every 5 (five) minutes as needed for chest pain.   oxyCODONE-acetaminophen 5-325 MG tablet Commonly known as: PERCOCET/ROXICET Take 1 tablet by mouth every 8 (eight) hours as needed.   Papav-Phentolamine-Alprostadil 12-1-0.01 MG/ML Soln by Intracavernosal route.   Repatha SureClick 892 MG/ML Soaj Generic drug: Evolocumab ADMINISTER 1 ML UNDER THE SKIN EVERY 14 DAYS   sildenafil 20 MG tablet Commonly known as: REVATIO TAKE 1-5 TABLETS BY MOUTH PRIOR TO INTERCOURSE AS NEEDED.        Allergies:  Allergies  Allergen Reactions   Statins     Myalgia    Penicillins Rash    Family History: Family History  Problem Relation Age of Onset   Arthritis Mother    Heart disease Mother    Cancer Father    Early death Brother    Cancer Brother        oral cancer  COPD Brother    Arthritis Sister    Drug abuse Sister    Early death Sister    Cancer Maternal Aunt        Colon cancer     Social History:  reports that he quit smoking about 35 years ago. His smoking use included cigarettes. He has never used smokeless tobacco. He reports that he does not drink alcohol and does not use drugs.  ROS: All other review of systems were reviewed and are negative except what is noted above in HPI   Laboratory Data: Lab Results  Component Value Date   WBC 6.5 01/02/2020   HGB 14.8 01/02/2020   HCT 42.1 01/02/2020   MCV 84.0 01/02/2020   PLT 355 01/02/2020    Lab Results  Component Value Date   CREATININE 0.89 01/02/2020    Lab Results  Component Value Date   PSA 1.41 01/02/2020   PSA 1.4  06/11/2018    No results found for: TESTOSTERONE  Lab Results  Component Value Date   HGBA1C 5.9 (H) 02/16/2020    Urinalysis    Component Value Date/Time   BILIRUBINUR neg 04/29/2019 1433   PROTEINUR Negative 04/29/2019 1433   UROBILINOGEN negative (A) 04/29/2019 1433   NITRITE neg 04/29/2019 1433   LEUKOCYTESUR Negative 04/29/2019 1433    No results found for: LABMICR, [place], RBCUA, LABEPIT, MUCUS, BACTERIA  Pertinent Imaging:  No results found for this or any previous visit.  No results found for this or any previous visit.  No results found for this or any previous visit.  No results found for this or any previous visit.  No results found for this or any previous visit.  No results found for this or any previous visit.  No results found for this or any previous visit.  No results found for this or any previous visit.   Assessment & Plan:    1. Erectile dysfunction due to arterial insufficiency -We will trial tadalfil 20mg  prn  2. BPH with urinary obstruction -We will try uroxatral 10mg  qhs  3. Nocturia -uroxatral 10mg  qhs   No follow-ups on file.  Nicolette Bang, MD  [facility]

## 2020-10-22 ENCOUNTER — Other Ambulatory Visit: Payer: Self-pay | Admitting: Student

## 2020-11-01 ENCOUNTER — Other Ambulatory Visit: Payer: Self-pay | Admitting: Urology

## 2021-02-04 ENCOUNTER — Other Ambulatory Visit: Payer: Self-pay | Admitting: Urology

## 2021-04-18 ENCOUNTER — Other Ambulatory Visit: Payer: Self-pay | Admitting: Urology

## 2021-04-18 ENCOUNTER — Telehealth: Payer: Self-pay

## 2021-04-18 ENCOUNTER — Other Ambulatory Visit: Payer: Self-pay

## 2021-04-18 NOTE — Telephone Encounter (Signed)
Refill submitted by Dr. Jeffie Pollock ?

## 2021-04-18 NOTE — Telephone Encounter (Signed)
Patient called advising he needed a refill on ildenafil (REVATIO). He also advised that  the injection for ED he was having sent to a compounding pharmacy in [place] and advised he spoke with DR. McKenzie last year about sending it to another pharmacy that had a compounding dept. I see on his chard that [facility] has one but wasn't sure if they were able to compound this medication specifically.  ?He advised the injection was alprostadil, papaverine, and hcl phentolamine. ? ? ?Pharmacy:  ?[facility], [place] ? ? ?Medication: ?ildenafil (REVATIO) 20 MG tablet ? ? ?

## 2021-07-14 IMAGING — DX DG ANKLE COMPLETE 3+V*L*
3 series · 3 of 3 positions shown · non-contrast
Comparison: None.

CLINICAL DATA: Ankle pain. Foot pain.

EXAM:
LEFT ANKLE COMPLETE - 3+ VIEW

[ankle ap]
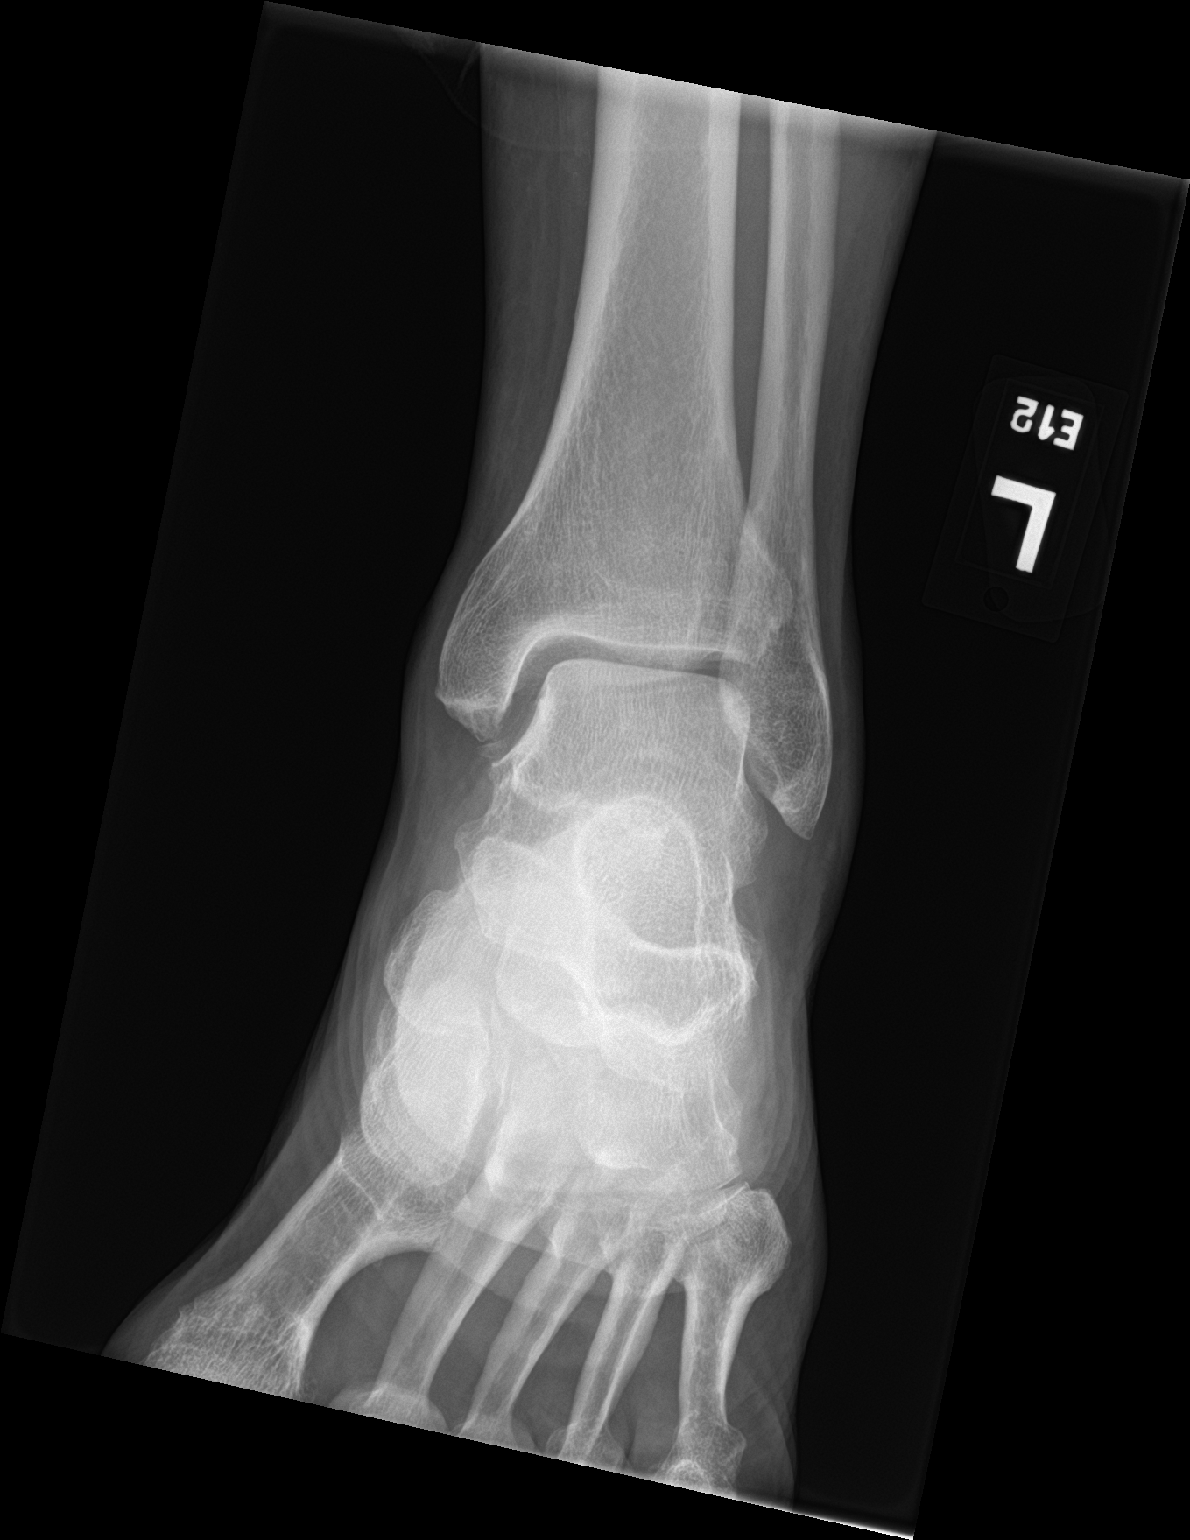

[ankle obl]
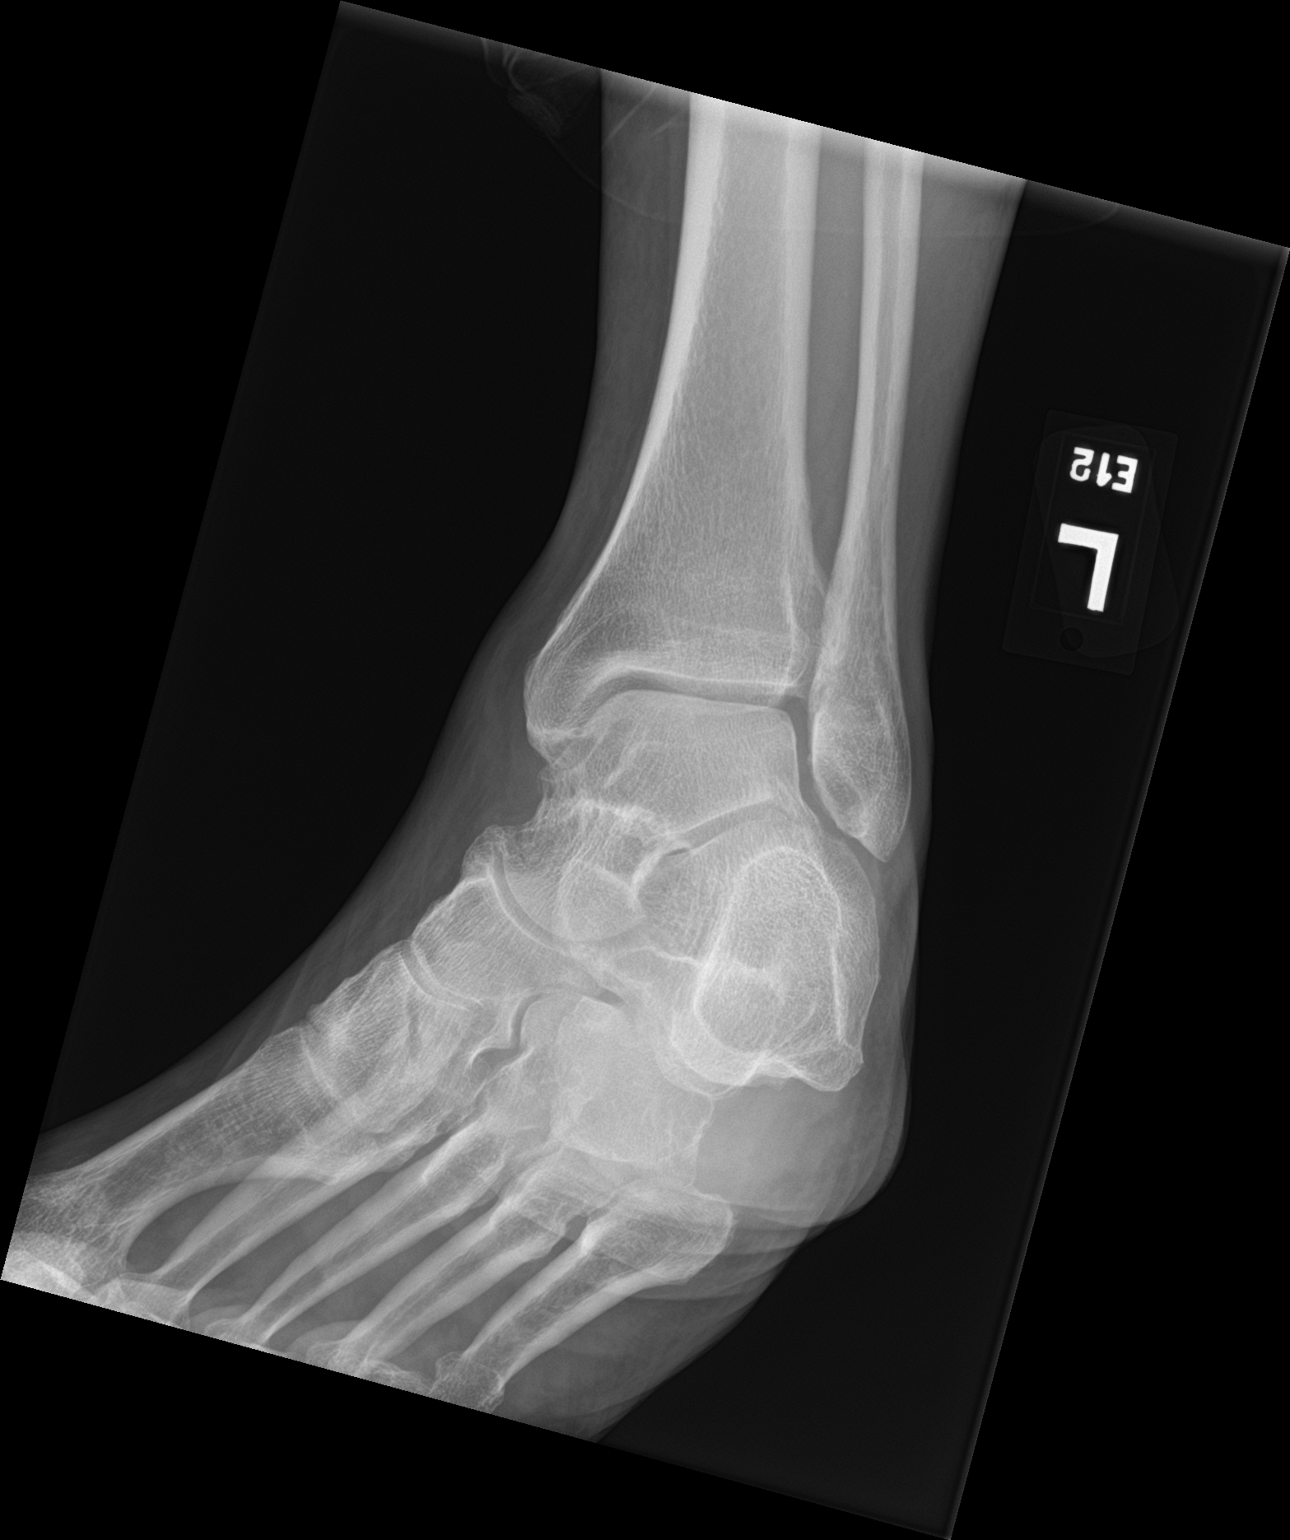

[ankle lat]
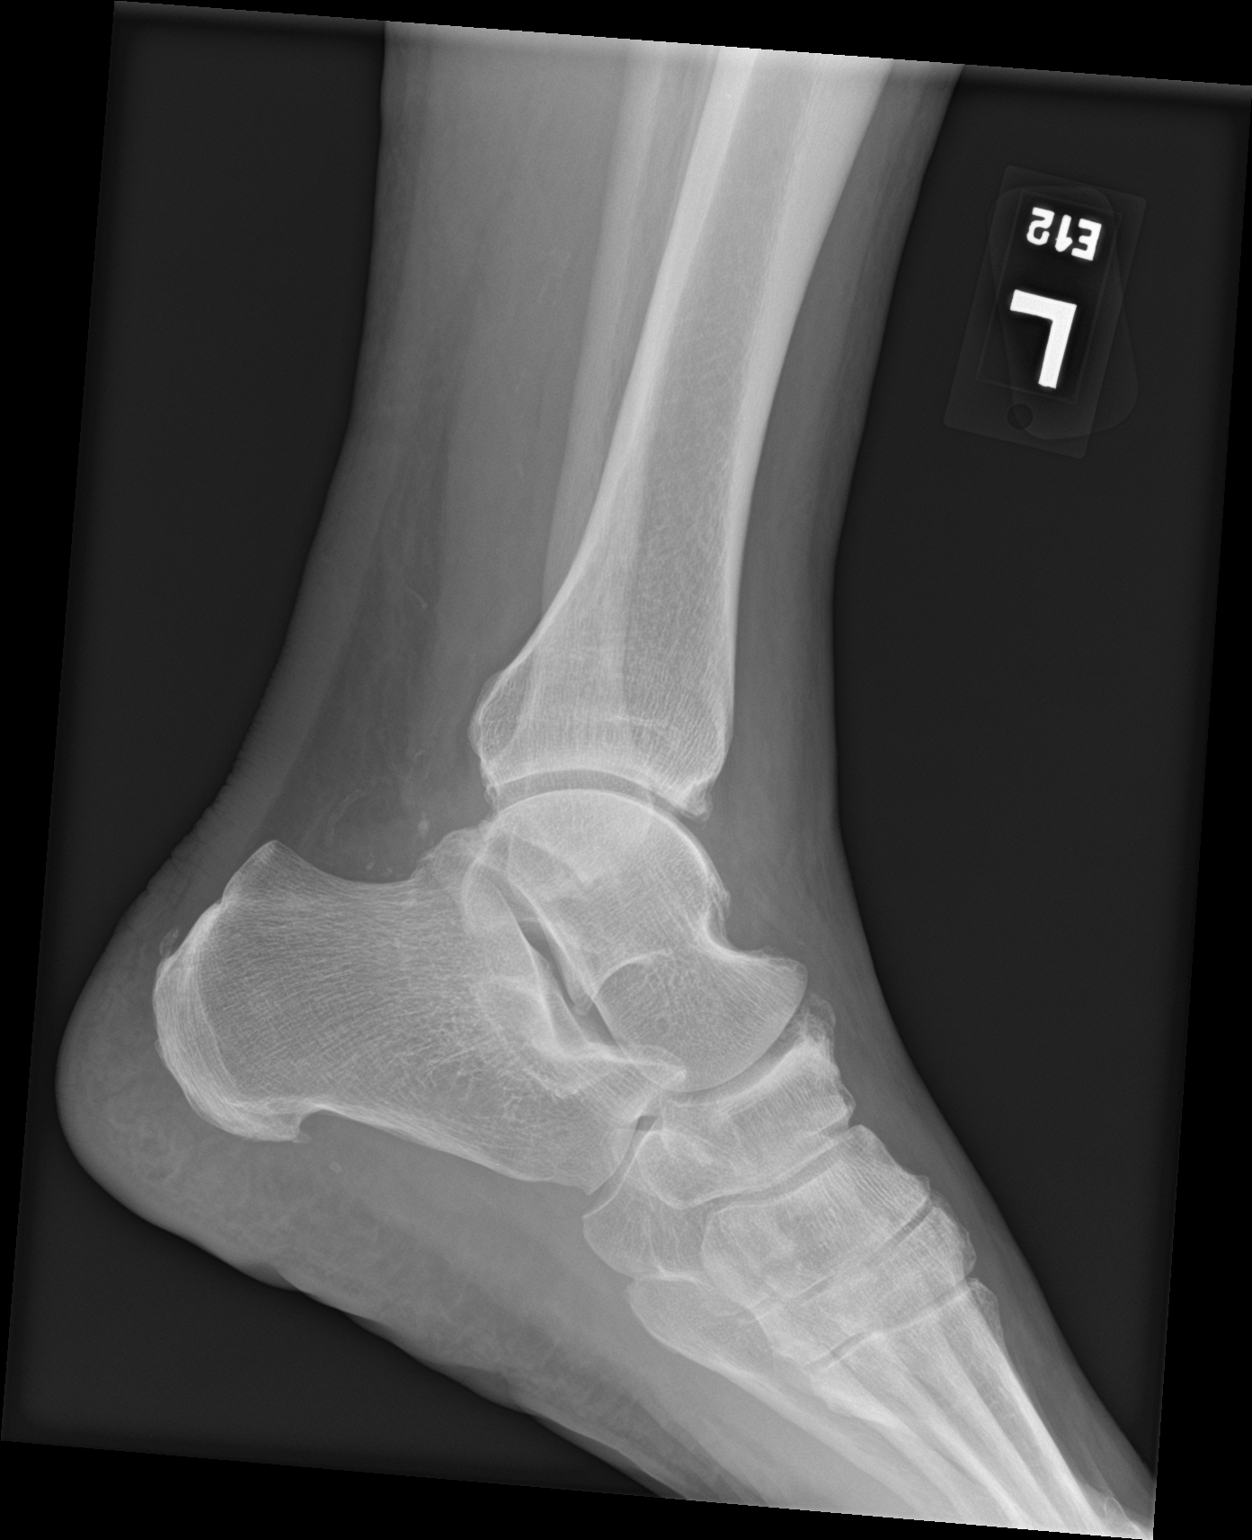

[3 of 3 positions shown; findings below may reference images not displayed]

FINDINGS: No acute fracture or dislocation. No aggressive osseous lesion.
Normal alignment. Mild osteoarthritis of the talonavicular joint and
tibiotalar joint. Small plantar calcaneal spur. Enthesopathic
changes of the Achilles tendon insertion. Small well corticated bony
fragment at the distal tip of the medial malleolus likely reflecting
sequela prior injury.

Soft tissue are unremarkable. No radiopaque foreign body or soft
tissue emphysema. Peripheral vascular atherosclerotic disease.
IMPRESSION: 1. No acute osseous injury of the left ankle.
2. Mild osteoarthritis of the talonavicular joint and tibiotalar
joint.

## 2021-07-29 ENCOUNTER — Other Ambulatory Visit: Payer: Self-pay | Admitting: Student

## 2021-07-31 ENCOUNTER — Telehealth: Payer: Self-pay | Admitting: Cardiology

## 2021-07-31 MED ORDER — REPATHA SURECLICK 140 MG/ML ~~LOC~~ SOAJ: SUBCUTANEOUS | 3 refills | Status: DC

## 2021-07-31 NOTE — Telephone Encounter (Signed)
Completed.

## 2021-07-31 NOTE — Telephone Encounter (Signed)
*  STAT* If patient is at the pharmacy, call can be transferred to refill team.   1. Which medications need to be refilled? (please list name of each medication and dose if known) REPATHA SURECLICK 980 MG/ML SOAJ  2. Which pharmacy/location (including street and city if local pharmacy) is medication to be sent to?  [id number] - [place], [place] - [address] 3. Do they need a 30 day or 90 day supply?  30 day

## 2021-08-11 ENCOUNTER — Other Ambulatory Visit: Payer: Self-pay | Admitting: Cardiology

## 2021-08-22 ENCOUNTER — Ambulatory Visit: Payer: Medicare HMO | Admitting: Orthopedic Surgery

## 2021-10-09 ENCOUNTER — Ambulatory Visit: Payer: Medicare HMO | Admitting: Urology

## 2021-10-28 NOTE — Progress Notes (Deleted)
Cardiology Office Note    Date:  10/28/2021   ID:  Stephen Booker, DOB [birth year], MRN [id number]  PCP:  Buzzy Han, MD (Inactive)  Cardiologist:  Rozann Lesches, MD  Electrophysiologist:  None   Chief Complaint: ***  History of Present Illness:   Stephen Booker is a 71 y.o. male with history of CAD s/p CABG 2014, HTN, HLD, hypothyroidism, arthritis, chronic back pain, mild AS/MR by echo 09/2021 who is seen for follow-up. He previously saw Dr. Bronson Ing then Dr. Domenic Polite last in 2022. Last stress test in 2019 showed hypertensive response to exercise and possible soft tissue attenuation, no definite ischemia, low risk, EF 64%. Per [facility] he was seen at an outside facility in [place] in 09/2021 for UTI, fever, tachycardia, hypotension, dx with sepsis due to UTI. Per notes, hs-troponins were elevated to peak of 272, seen by cardiology that admission, felt due to demand ischemia. 2D echo showed EF 55-60%, mild AS, MR, TR, PASP 4mHg, normal RV. No specific arrhythmia mentioned at that time.   close outpatient follow up with his primary cardiologist to discuss outpatient ischemic evaluation after resolution of current illness Abnormal tsh  CAD Essential HTN Hyperlipidemia Mild AS, MR, TR by echo 09/2021  Labwork independently reviewed: K 3.9, Cr 0.8, albumin 3.3, Hgb 12.3, plt 187, TSH 0.42 (recommended OP f/u), Mg 2.1 2021 LDL 75  Cardiology Studies:   Studies reviewed are outlined and summarized above. Reports included below if pertinent.   [facility] Echo 09/26/21 This result has an attachment that is not available.   Normal left ventricular ejection fraction estimated at 55-65%. Simpson's  biplane EF is 73%. 3D volumes EF is 61%.   Normal left ventricular diastolic function.   Mild aortic valve stenosis.   Mild mitral and tricuspid regurgitation.   PASP is 42 mmHg.   Normal RV systolic function.   Mild mitral valve regurgitation.   Mild aortic valve  stenosis.    Left Ventricle  Normal cavity size and wall thickness. Normal left ventricular ejection fraction estimated at 55-65%. Quantitative EF using Simpson's Biplane is 73%. Quantitative EF using 3D volumes is 61%. Global Longitudinal Strain: -19%. Global Longitudinal Strain was performed using a [employer] ultrasound system and [employer] Image[employer]rena post processing software. There are no obvious wall motion abnormalities. Normal left ventricular diastolic function.   Right Ventricle  Normal cavity size, systolic function, tricuspid annular plane systolic excursion (TAPSE) and systolic excursion velocity by TDI.   Left Atrium  Normal cavity size. LA volume index is normal.   Right Atrium  Normal cavity size.   IVC/SVC  The IVC is dilated (>2.1cm) and collapses less than 50%.   Mitral Valve  Normal valve structure. Mild regurgitation. No stenosis.   Tricuspid Valve  Normal tricuspid valve structure. Mild regurgitation. No stenosis.   Aortic Valve  The aortic valve is thickened. No regurgitation. Mild stenosis.   Pulmonic Valve  Normal valve structure. Mild regurgitation. No stenosis. PASP is 42 mmHg.   Pericardium  No pericardial effusion.   TTE Procedure Information  Image quality: Adequate. Myocardial strain imaging and 3D rendering with interpretation and post processing under concurrent supervision not requiring an independent workstation has been performed.   Aorta  Normal size aortic root and ascending aorta.   Scanned echo 2017- there is no report  NST 2019 No diagnostic ST segment changes to indicate ischemia. No chest pain reported. Mild hypertensive response. No arrhythmias. Low risk Duke treadmill score of 4.5. Blood pressure demonstrated a hypertensive response  to exercise. Small, mild intensity, fixed inferior defect that is most prominent on rest imaging and consistent with soft tissue attenuation, less likely scar. No definitive ischemia. This is a low risk  study. Nuclear stress EF: 64%.    Past Medical History:  Diagnosis Date   Arthritis    Back pain    CAD (coronary artery disease)    Status post CABG 2014   Diverticulitis    Essential hypertension    Hyperlipidemia    Hypothyroidism     Past Surgical History:  Procedure Laterality Date   CORONARY ARTERY BYPASS GRAFT     INGUINAL HERNIA REPAIR     KNEE ARTHROSCOPY Right    x2   KNEE ARTHROSCOPY Right    has had 2 right knee arthroscopies one when he was in his 54s the other in his 89W   UMBILICAL HERNIA REPAIR      Current Medications: No outpatient medications have been marked as taking for the 10/29/21 encounter (Appointment) with [name], PA-C.   ***   Allergies:   Statins and Penicillins   Social History   Socioeconomic History   Marital status: Divorced    Spouse name: Not on file   Number of children: Not on file   Years of education: Not on file   Highest education level: Not on file  Occupational History   Not on file  Tobacco Use   Smoking status: Former    Types: Cigarettes    Quit date: 01/06/1985    Years since quitting: 36.8   Smokeless tobacco: Never  Vaping Use   Vaping Use: Never used  Substance and Sexual Activity   Alcohol use: No   Drug use: No   Sexual activity: Not on file  Other Topics Concern   Not on file  Social History Narrative   Not on file   Social Determinants of Health   Financial Resource Strain: Low Risk  (11/09/2019)   Overall Financial Resource Strain (CARDIA)    Difficulty of Paying Living Expenses: Not very hard  Food Insecurity: Not on file  Transportation Needs: Not on file  Physical Activity: Not on file  Stress: Not on file  Social Connections: Not on file     Family History:  The patient's ***family history includes Arthritis in his mother and sister; COPD in his brother; Cancer in his brother, father, and maternal aunt; Drug abuse in his sister; Early death in his brother and sister; Heart disease  in his mother.  ROS:   Please see the history of present illness. Otherwise, review of systems is positive for ***.  All other systems are reviewed and otherwise negative.    EKG(s)/Additional Labs   EKG:  EKG is ordered today, personally reviewed, demonstrating ***  Recent Labs: No results found for requested labs within last 365 days.  Recent Lipid Panel    Component Value Date/Time   CHOL 153 01/02/2020 1214   CHOL 257 (H) 12/17/2017 1426   TRIG 251 (H) 01/02/2020 1214   HDL 45 01/02/2020 1214   HDL 53 12/17/2017 1426   CHOLHDL 3.4 01/02/2020 1214   VLDL 37 09/10/2016 0929   LDLCALC 75 01/02/2020 1214    PHYSICAL EXAM:    VS:  There were no vitals taken for this visit.  BMI: There is no height or weight on file to calculate BMI.  GEN: Well nourished, well developed male in no acute distress HEENT: normocephalic, atraumatic Neck: no JVD, carotid bruits, or masses  Cardiac: ***RRR; no murmurs, rubs, or gallops, no edema  Respiratory:  clear to auscultation bilaterally, normal work of breathing GI: soft, nontender, nondistended, + BS MS: no deformity or atrophy Skin: warm and dry, no rash Neuro:  Alert and Oriented x 3, Strength and sensation are intact, follows commands Psych: euthymic mood, full affect  Wt Readings from Last 3 Encounters:  05/31/20 187 lb (84.8 kg)  05/14/20 180 lb (81.6 kg)  03/15/20 174 lb (78.9 kg)     ASSESSMENT & PLAN:   ***     Disposition: F/u with ***   Medication Adjustments/Labs and Tests Ordered: Current medicines are reviewed at length with the patient today.  Concerns regarding medicines are outlined above. Medication changes, Labs and Tests ordered today are summarized above and listed in the Patient Instructions accessible in Encounters.    Signed, [name], PA-C  10/28/2021 11:27 AM    [facility] [place]. [place], [place] [address] Ph: [phone]; Fax  [phone]

## 2021-10-29 ENCOUNTER — Ambulatory Visit: Payer: Medicare HMO | Admitting: Physician Assistant

## 2022-01-27 ENCOUNTER — Other Ambulatory Visit (HOSPITAL_COMMUNITY): Payer: Self-pay

## 2022-03-17 ENCOUNTER — Other Ambulatory Visit: Payer: Self-pay | Admitting: Student

## 2022-03-17 DIAGNOSIS — I25708 Atherosclerosis of coronary artery bypass graft(s), unspecified, with other forms of angina pectoris: Secondary | ICD-10-CM

## 2022-04-09 ENCOUNTER — Other Ambulatory Visit: Payer: Self-pay | Admitting: Cardiology

## 2022-04-09 DIAGNOSIS — I25708 Atherosclerosis of coronary artery bypass graft(s), unspecified, with other forms of angina pectoris: Secondary | ICD-10-CM

## 2022-04-13 ENCOUNTER — Other Ambulatory Visit: Payer: Self-pay | Admitting: Cardiology

## 2022-04-13 DIAGNOSIS — I25708 Atherosclerosis of coronary artery bypass graft(s), unspecified, with other forms of angina pectoris: Secondary | ICD-10-CM

## 2022-11-03 ENCOUNTER — Other Ambulatory Visit: Payer: Self-pay | Admitting: Cardiology

## 2022-11-03 DIAGNOSIS — I25708 Atherosclerosis of coronary artery bypass graft(s), unspecified, with other forms of angina pectoris: Secondary | ICD-10-CM
